# Patient Record
Sex: Female | Born: 1975 | Race: Black or African American | Hispanic: No | Marital: Married | Smoking: Never smoker
Health system: Southern US, Community
[De-identification: ages and names within clinical notes are randomized; demographics above are authoritative.]

## PROBLEM LIST (undated history)

## (undated) DIAGNOSIS — R7611 Nonspecific reaction to tuberculin skin test without active tuberculosis: Secondary | ICD-10-CM

## (undated) DIAGNOSIS — J45909 Unspecified asthma, uncomplicated: Secondary | ICD-10-CM

## (undated) DIAGNOSIS — D259 Leiomyoma of uterus, unspecified: Secondary | ICD-10-CM

## (undated) DIAGNOSIS — B019 Varicella without complication: Secondary | ICD-10-CM

## (undated) HISTORY — DX: Leiomyoma of uterus, unspecified: D25.9

## (undated) HISTORY — DX: Unspecified asthma, uncomplicated: J45.909

## (undated) HISTORY — DX: Nonspecific reaction to tuberculin skin test without active tuberculosis: R76.11

## (undated) HISTORY — DX: Varicella without complication: B01.9

---

## 2001-01-30 ENCOUNTER — Other Ambulatory Visit: Admission: RE | Admit: 2001-01-30 | Discharge: 2001-01-30 | Payer: Self-pay | Admitting: Internal Medicine

## 2002-04-09 ENCOUNTER — Inpatient Hospital Stay (HOSPITAL_COMMUNITY): Admission: AD | Admit: 2002-04-09 | Discharge: 2002-04-09 | Payer: Self-pay | Admitting: *Deleted

## 2002-04-19 ENCOUNTER — Encounter: Admission: RE | Admit: 2002-04-19 | Discharge: 2002-04-19 | Payer: Self-pay | Admitting: Obstetrics and Gynecology

## 2002-11-08 ENCOUNTER — Encounter: Payer: Self-pay | Admitting: *Deleted

## 2002-11-08 ENCOUNTER — Emergency Department (HOSPITAL_COMMUNITY): Admission: EM | Admit: 2002-11-08 | Discharge: 2002-11-08 | Payer: Self-pay | Admitting: Emergency Medicine

## 2004-08-14 ENCOUNTER — Ambulatory Visit (HOSPITAL_COMMUNITY): Admission: RE | Admit: 2004-08-14 | Discharge: 2004-08-14 | Payer: Self-pay | Admitting: Obstetrics & Gynecology

## 2005-01-06 ENCOUNTER — Inpatient Hospital Stay (HOSPITAL_COMMUNITY): Admission: AD | Admit: 2005-01-06 | Discharge: 2005-01-06 | Payer: Self-pay | Admitting: Obstetrics

## 2005-01-08 ENCOUNTER — Inpatient Hospital Stay (HOSPITAL_COMMUNITY): Admission: AD | Admit: 2005-01-08 | Discharge: 2005-01-11 | Payer: Self-pay | Admitting: Obstetrics

## 2006-05-10 ENCOUNTER — Emergency Department (HOSPITAL_COMMUNITY): Admission: EM | Admit: 2006-05-10 | Discharge: 2006-05-10 | Payer: Self-pay | Admitting: Family Medicine

## 2006-07-19 ENCOUNTER — Other Ambulatory Visit: Admission: RE | Admit: 2006-07-19 | Discharge: 2006-07-19 | Payer: Self-pay | Admitting: Internal Medicine

## 2010-02-24 ENCOUNTER — Ambulatory Visit (HOSPITAL_COMMUNITY): Admission: RE | Admit: 2010-02-24 | Discharge: 2010-02-24 | Payer: Self-pay | Admitting: Obstetrics & Gynecology

## 2010-07-16 ENCOUNTER — Inpatient Hospital Stay (HOSPITAL_COMMUNITY): Admission: RE | Admit: 2010-07-16 | Discharge: 2010-07-19 | Payer: Self-pay | Admitting: Obstetrics & Gynecology

## 2011-01-07 LAB — CBC
HCT: 33.3 % — ABNORMAL LOW (ref 36.0–46.0)
HCT: 38.7 % (ref 36.0–46.0)
Hemoglobin: 11.2 g/dL — ABNORMAL LOW (ref 12.0–15.0)
Hemoglobin: 12.9 g/dL (ref 12.0–15.0)
MCH: 28.6 pg (ref 26.0–34.0)
MCH: 28.8 pg (ref 26.0–34.0)
MCHC: 33.3 g/dL (ref 30.0–36.0)
MCHC: 33.8 g/dL (ref 30.0–36.0)
MCV: 85.3 fL (ref 78.0–100.0)
MCV: 85.6 fL (ref 78.0–100.0)
Platelets: 168 10*3/uL (ref 150–400)
Platelets: 186 10*3/uL (ref 150–400)
RBC: 3.9 MIL/uL (ref 3.87–5.11)
RBC: 4.52 MIL/uL (ref 3.87–5.11)
RDW: 14.5 % (ref 11.5–15.5)
RDW: 14.5 % (ref 11.5–15.5)
WBC: 12.2 10*3/uL — ABNORMAL HIGH (ref 4.0–10.5)
WBC: 7.9 10*3/uL (ref 4.0–10.5)

## 2011-01-07 LAB — RPR: RPR Ser Ql: NONREACTIVE

## 2011-08-16 ENCOUNTER — Other Ambulatory Visit: Payer: Self-pay

## 2011-08-16 ENCOUNTER — Other Ambulatory Visit (HOSPITAL_COMMUNITY)
Admission: RE | Admit: 2011-08-16 | Discharge: 2011-08-16 | Disposition: A | Discharge status: Home / Self Care | Payer: Self-pay | Source: Ambulatory Visit | Attending: Family Medicine | Admitting: Family Medicine

## 2011-08-16 DIAGNOSIS — Z01419 Encounter for gynecological examination (general) (routine) without abnormal findings: Secondary | ICD-10-CM | POA: Insufficient documentation

## 2013-02-19 ENCOUNTER — Other Ambulatory Visit (HOSPITAL_COMMUNITY): Payer: Self-pay | Admitting: Orthopaedic Surgery

## 2013-02-19 DIAGNOSIS — M25532 Pain in left wrist: Secondary | ICD-10-CM

## 2013-02-21 ENCOUNTER — Ambulatory Visit (HOSPITAL_COMMUNITY)
Admission: RE | Admit: 2013-02-21 | Discharge: 2013-02-21 | Disposition: A | Discharge status: Home / Self Care | Payer: Self-pay | Source: Ambulatory Visit | Attending: Orthopaedic Surgery | Admitting: Orthopaedic Surgery

## 2013-02-21 DIAGNOSIS — W19XXXA Unspecified fall, initial encounter: Secondary | ICD-10-CM | POA: Insufficient documentation

## 2013-02-21 DIAGNOSIS — S52599A Other fractures of lower end of unspecified radius, initial encounter for closed fracture: Secondary | ICD-10-CM | POA: Insufficient documentation

## 2013-02-21 DIAGNOSIS — M25539 Pain in unspecified wrist: Secondary | ICD-10-CM | POA: Insufficient documentation

## 2013-02-21 DIAGNOSIS — M25532 Pain in left wrist: Secondary | ICD-10-CM

## 2013-04-16 ENCOUNTER — Ambulatory Visit: Payer: Self-pay | Attending: Orthopedic Surgery | Admitting: Physical Therapy

## 2013-04-16 DIAGNOSIS — M25639 Stiffness of unspecified wrist, not elsewhere classified: Secondary | ICD-10-CM | POA: Insufficient documentation

## 2013-04-16 DIAGNOSIS — Z4789 Encounter for other orthopedic aftercare: Secondary | ICD-10-CM | POA: Insufficient documentation

## 2013-04-16 DIAGNOSIS — M25549 Pain in joints of unspecified hand: Secondary | ICD-10-CM | POA: Insufficient documentation

## 2013-04-16 DIAGNOSIS — IMO0001 Reserved for inherently not codable concepts without codable children: Secondary | ICD-10-CM | POA: Insufficient documentation

## 2013-04-25 ENCOUNTER — Ambulatory Visit: Payer: Self-pay | Admitting: Physical Therapy

## 2013-11-27 ENCOUNTER — Other Ambulatory Visit: Payer: Self-pay | Admitting: Family Medicine

## 2013-11-27 ENCOUNTER — Other Ambulatory Visit (HOSPITAL_COMMUNITY)
Admission: RE | Admit: 2013-11-27 | Discharge: 2013-11-27 | Disposition: A | Discharge status: Home / Self Care | Payer: 59 | Source: Ambulatory Visit | Attending: Family Medicine | Admitting: Family Medicine

## 2013-11-27 DIAGNOSIS — Z0000 Encounter for general adult medical examination without abnormal findings: Secondary | ICD-10-CM | POA: Insufficient documentation

## 2014-03-28 ENCOUNTER — Other Ambulatory Visit: Payer: Self-pay | Admitting: Family Medicine

## 2014-03-28 DIAGNOSIS — E010 Iodine-deficiency related diffuse (endemic) goiter: Secondary | ICD-10-CM

## 2014-04-02 ENCOUNTER — Encounter (INDEPENDENT_AMBULATORY_CARE_PROVIDER_SITE_OTHER): Payer: Self-pay

## 2014-04-02 ENCOUNTER — Ambulatory Visit
Admission: RE | Admit: 2014-04-02 | Discharge: 2014-04-02 | Disposition: A | Discharge status: Home / Self Care | Payer: 59 | Source: Ambulatory Visit | Attending: Family Medicine | Admitting: Family Medicine

## 2014-04-02 DIAGNOSIS — E010 Iodine-deficiency related diffuse (endemic) goiter: Secondary | ICD-10-CM

## 2016-09-05 DIAGNOSIS — R002 Palpitations: Secondary | ICD-10-CM | POA: Insufficient documentation

## 2016-09-05 DIAGNOSIS — R000 Tachycardia, unspecified: Secondary | ICD-10-CM | POA: Diagnosis not present

## 2016-09-06 ENCOUNTER — Emergency Department (HOSPITAL_COMMUNITY)
Admission: EM | Admit: 2016-09-06 | Discharge: 2016-09-06 | Disposition: A | Discharge status: Home / Self Care | Payer: 59 | Attending: Emergency Medicine | Admitting: Emergency Medicine

## 2016-09-06 ENCOUNTER — Encounter (HOSPITAL_COMMUNITY): Payer: Self-pay | Admitting: Emergency Medicine

## 2016-09-06 ENCOUNTER — Emergency Department (HOSPITAL_COMMUNITY): Payer: 59

## 2016-09-06 DIAGNOSIS — R002 Palpitations: Secondary | ICD-10-CM | POA: Diagnosis not present

## 2016-09-06 DIAGNOSIS — R000 Tachycardia, unspecified: Secondary | ICD-10-CM | POA: Diagnosis not present

## 2016-09-06 LAB — CBC
HCT: 37.9 % (ref 36.0–46.0)
Hemoglobin: 12.4 g/dL (ref 12.0–15.0)
MCH: 26.6 pg (ref 26.0–34.0)
MCHC: 32.7 g/dL (ref 30.0–36.0)
MCV: 81.3 fL (ref 78.0–100.0)
Platelets: 287 10*3/uL (ref 150–400)
RBC: 4.66 MIL/uL (ref 3.87–5.11)
RDW: 13.2 % (ref 11.5–15.5)
WBC: 4.7 10*3/uL (ref 4.0–10.5)

## 2016-09-06 LAB — DIFFERENTIAL
Basophils Absolute: 0 10*3/uL (ref 0.0–0.1)
Basophils Relative: 0 %
Eosinophils Absolute: 0.3 10*3/uL (ref 0.0–0.7)
Eosinophils Relative: 5 %
Lymphocytes Relative: 39 %
Lymphs Abs: 1.9 10*3/uL (ref 0.7–4.0)
Monocytes Absolute: 0.6 10*3/uL (ref 0.1–1.0)
Monocytes Relative: 12 %
Neutro Abs: 2 10*3/uL (ref 1.7–7.7)
Neutrophils Relative %: 44 %

## 2016-09-06 LAB — BASIC METABOLIC PANEL
Anion gap: 8 (ref 5–15)
BUN: 12 mg/dL (ref 6–20)
CO2: 25 mmol/L (ref 22–32)
Calcium: 9.3 mg/dL (ref 8.9–10.3)
Chloride: 105 mmol/L (ref 101–111)
Creatinine, Ser: 0.89 mg/dL (ref 0.44–1.00)
GFR calc Af Amer: >60 mL/min (ref >60)
GFR calc non Af Amer: >60 mL/min (ref >60)
Glucose, Bld: 81 mg/dL (ref 65–99)
Potassium: 3.9 mmol/L (ref 3.5–5.1)
Sodium: 138 mmol/L (ref 135–145)

## 2016-09-06 LAB — I-STAT TROPONIN, ED: Troponin i, poc: 0 ng/mL (ref 0.00–0.08)

## 2016-09-06 NOTE — ED Triage Notes (Signed)
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  States she may have gotten too hot in the room she was in.

## 2016-09-06 NOTE — ED Provider Notes (Signed)
[facility] DEPT Provider Note   CSN: [id number] Arrival date & time: 09/05/16  2352 By signing my name below, I, Dyke Brackett, attest that this documentation has been prepared under the direction and in the presence of Delora Fuel, MD . Electronically Signed: Dyke Brackett, Scribe. 09/06/2016. 12:59 AM.   History   Chief Complaint Chief Complaint  Patient presents with  . Tachycardia   HPI Erika Blair is a 40 y.o. female who presents to the Emergency Department complaining of sudden onset palpitations tonight which have since resolved. She notes associated nausea, lightheadedness and "feeling hot". Pt drank cold water with some relief. She states lightheadedness is exacerbated by standing up. Pt states she may have gotten too hot in the room she was in. She denies SOB, chest pain, vomiting, or diaphoresis.   The history is provided by the patient. No language interpreter was used.    History reviewed. No pertinent past medical history.  There are no active problems to display for this patient.   History reviewed. No pertinent surgical history.  OB History    No data available      Home Medications    Prior to Admission medications   Not on File    Family History No family history on file.  Social History Social History  Substance Use Topics  . Smoking status: Never Smoker  . Smokeless tobacco: Never Used  . Alcohol use No     Allergies   Patient has no allergy information on record.   Review of Systems Review of Systems  Constitutional: Negative for diaphoresis.  Respiratory: Negative for shortness of breath.   Cardiovascular: Positive for palpitations. Negative for chest pain.  Gastrointestinal: Negative for vomiting.  Neurological: Positive for light-headedness.  All other systems reviewed and are negative.  Physical Exam Updated Vital Signs BP 118/89 (BP Location: Left Arm)   Pulse 78   Temp 98.1 F (36.7 C) (Oral)   Resp 22   Ht 5'  6" (1.676 m)   Wt 190 lb (86.2 kg)   LMP 08/30/2016   SpO2 100%   BMI 30.67 kg/m   Physical Exam  Constitutional: She is oriented to person, place, and time. She appears well-developed and well-nourished.  HENT:  Head: Normocephalic and atraumatic.  Eyes: EOM are normal. Pupils are equal, round, and reactive to light.  Neck: Normal range of motion. Neck supple. No JVD present.  Cardiovascular: Normal rate, regular rhythm and normal heart sounds.   No murmur heard. Pulmonary/Chest: Effort normal and breath sounds normal. She has no wheezes. She has no rales. She exhibits no tenderness.  Abdominal: Soft. Bowel sounds are normal. She exhibits no distension and no mass. There is no tenderness.  Musculoskeletal: Normal range of motion. She exhibits no edema.  Lymphadenopathy:    She has no cervical adenopathy.  Neurological: She is alert and oriented to person, place, and time. No cranial nerve deficit. She exhibits normal muscle tone. Coordination normal.  Skin: Skin is warm and dry. No rash noted.  Psychiatric: She has a normal mood and affect. Her behavior is normal. Judgment and thought content normal.  Nursing note and vitals reviewed.  ED Treatments / Results  DIAGNOSTIC STUDIES:  Oxygen Saturation is 100% on RA, normal by my interpretation.    COORDINATION OF CARE:  12:57 AM Discussed treatment plan with pt at bedside and pt agreed to plan.  Labs (all labs ordered are listed, but only abnormal results are displayed) Labs Reviewed  BASIC METABOLIC PANEL  CBC  DIFFERENTIAL  I-STAT TROPOININ, ED    EKG  EKG Interpretation  Date/Time:  Monday September 06 2016 00:06:20 EST Ventricular Rate:  78 PR Interval:  154 QRS Duration: 86 QT Interval:  340 QTC Calculation: 387 R Axis:   74 Text Interpretation:  Normal sinus rhythm Normal ECG No old tracing to compare Confirmed by [facility]  MD, Czarina Gingras ([id number]) on 09/06/2016 12:11:49 AM      Procedures Procedures (including  critical care time)  Medications Ordered in ED Medications - No data to display   Initial Impression / Assessment and Plan / ED Course  I have reviewed the triage vital signs and the nursing notes.  Pertinent labs & imaging results that were available during my care of the patient were reviewed by me and considered in my medical decision making (see chart for details).  Clinical Course    Episode of palpitations which has resolved. Of note, patient states that she was still having the sense of rapid heartbeat at times that ECG was done and showed normal sinus rhythm. Screening labs are obtained and are normal. Orthostatic vital signs are checked showing no significant change in pulse or blood pressure. Patient is felt to be safe to return to work. Old records are reviewed, and she has no relevant past visits.  Final Clinical Impressions(s) / ED Diagnoses   Final diagnoses:  Palpitations    New Prescriptions New Prescriptions   No medications on file  I personally performed the services described in this documentation, which was scribed in my presence. The recorded information has been reviewed and is accurate.       Delora Fuel, MD [id number]

## 2016-09-28 ENCOUNTER — Telehealth: Payer: Self-pay | Admitting: *Deleted

## 2016-09-28 NOTE — Telephone Encounter (Signed)
ERROR

## 2016-11-01 ENCOUNTER — Ambulatory Visit: Payer: 59 | Admitting: Obstetrics and Gynecology

## 2016-11-01 ENCOUNTER — Encounter: Payer: Self-pay | Admitting: Obstetrics and Gynecology

## 2016-11-01 NOTE — Patient Instructions (Signed)
Laparoscopic Tubal Ligation Introduction Laparoscopic tubal ligation is a procedure to close the fallopian tubes. This is done so that you cannot get pregnant. When the fallopian tubes are closed, the eggs that your ovaries release cannot enter the uterus, and sperm cannot reach the released eggs. A laparoscopic tubal ligation is sometimes called "getting your tubes tied." You should not have this procedure if you want to get pregnant someday or if you are unsure about having more children. Tell a health care provider about:  Any allergies you have.  All medicines you are taking, including vitamins, herbs, eye drops, creams, and over-the-counter medicines.  Any problems you or family members have had with anesthetic medicines.  Any blood disorders you have.  Any surgeries you have had.  Any medical conditions you have.  Whether you are pregnant or may be pregnant.  Any past pregnancies. What are the risks? Generally, this is a safe procedure. However, problems may occur, including:  Infection.  Bleeding.  Injury to surrounding organs.  Side effects from anesthetics.  Failure of the procedure. This procedure can increase your risk of a kind of pregnancy in which a fertilized egg attaches to the outside of the uterus (ectopic pregnancy). What happens before the procedure?  Ask your health care provider about:  Changing or stopping your regular medicines. This is especially important if you are taking diabetes medicines or blood thinners.  Taking medicines such as aspirin and ibuprofen. These medicines can thin your blood. Do not take these medicines before your procedure if your health care provider instructs you not to.  Follow instructions from your health care provider about eating and drinking restrictions.  Plan to have someone take you home after the procedure.  If you go home right after the procedure, plan to have someone with you for 24 hours. What happens during  the procedure?  You will be given one or more of the following:  A medicine to help you relax (sedative).  A medicine to numb the area (local anesthetic).  A medicine to make you fall asleep (general anesthetic).  A medicine that is injected into an area of your body to numb everything below the injection site (regional anesthetic).  An IV tube will be inserted into one of your veins. It will be used to give you medicines and fluids during the procedure.  Your bladder may be emptied with a small tube (catheter).  If you have been given a general anesthetic, a tube will be put down your throat to help you breathe.  Two small cuts (incisions) will be made in your lower abdomen and near your belly button.  Your abdomen will be inflated with a gas. This will let the surgeon see better and will give the surgeon room to work.  A thin, lighted tube (laparoscope) with a camera attached will be inserted into your abdomen through one of the incisions. Small instruments will be inserted through the other incision.  The fallopian tubes will be tied off, burned (cauterized), or blocked with a clip, ring, or clamp. A small portion in the center of each fallopian tube may be removed.  The gas will be released from the abdomen.  The incisions will be closed with stitches (sutures).  A bandage (dressing) will be placed over the incisions. The procedure may vary among health care providers and hospitals. What happens after the procedure?  Your blood pressure, heart rate, breathing rate, and blood oxygen level will be monitored often until the medicines you  were given have worn off.  You will be given medicine to help with pain, nausea, and vomiting as needed. This information is not intended to replace advice given to you by your health care provider. Make sure you discuss any questions you have with your health care provider. Document Released: 01/17/2001 Document Revised: 03/18/2016 Document  Reviewed: 09/21/2015  2017 Elsevier   Contraception Choices Contraception (birth control) is the use of any methods or devices to prevent pregnancy. Below are some methods to help avoid pregnancy. Hormonal methods  Contraceptive implant. This is a thin, plastic tube containing progesterone hormone. It does not contain estrogen hormone. Your health care provider inserts the tube in the inner part of the upper arm. The tube can remain in place for up to 3 years. After 3 years, the implant must be removed. The implant prevents the ovaries from releasing an egg (ovulation), thickens the cervical mucus to prevent sperm from entering the uterus, and thins the lining of the inside of the uterus.  Progesterone-only injections. These injections are given every 3 months by your health care provider to prevent pregnancy. This synthetic progesterone hormone stops the ovaries from releasing eggs. It also thickens cervical mucus and changes the uterine lining. This makes it harder for sperm to survive in the uterus.  Birth control pills. These pills contain estrogen and progesterone hormone. They work by preventing the ovaries from releasing eggs (ovulation). They also cause the cervical mucus to thicken, preventing the sperm from entering the uterus. Birth control pills are prescribed by a health care provider.Birth control pills can also be used to treat heavy periods.  Minipill. This type of birth control pill contains only the progesterone hormone. They are taken every day of each month and must be prescribed by your health care provider.  Birth control patch. The patch contains hormones similar to those in birth control pills. It must be changed once a week and is prescribed by a health care provider.  Vaginal ring. The ring contains hormones similar to those in birth control pills. It is left in the vagina for 3 weeks, removed for 1 week, and then a new one is put back in place. The patient must be  comfortable inserting and removing the ring from the vagina.A health care provider's prescription is necessary.  Emergency contraception. Emergency contraceptives prevent pregnancy after unprotected sexual intercourse. This pill can be taken right after sex or up to 5 days after unprotected sex. It is most effective the sooner you take the pills after having sexual intercourse. Most emergency contraceptive pills are available without a prescription. Check with your pharmacist. Do not use emergency contraception as your only form of birth control. Barrier methods  Female condom. This is a thin sheath (latex or rubber) that is worn over the penis during sexual intercourse. It can be used with spermicide to increase effectiveness.  Female condom. This is a soft, loose-fitting sheath that is put into the vagina before sexual intercourse.  Diaphragm. This is a soft, latex, dome-shaped barrier that must be fitted by a health care provider. It is inserted into the vagina, along with a spermicidal jelly. It is inserted before intercourse. The diaphragm should be left in the vagina for 6 to 8 hours after intercourse.  Cervical cap. This is a round, soft, latex or plastic cup that fits over the cervix and must be fitted by a health care provider. The cap can be left in place for up to 48 hours after intercourse.  Sponge. This is a soft, circular piece of polyurethane foam. The sponge has spermicide in it. It is inserted into the vagina after wetting it and before sexual intercourse.  Spermicides. These are chemicals that kill or block sperm from entering the cervix and uterus. They come in the form of creams, jellies, suppositories, foam, or tablets. They do not require a prescription. They are inserted into the vagina with an applicator before having sexual intercourse. The process must be repeated every time you have sexual intercourse. Intrauterine contraception  Intrauterine device (IUD). This is a  T-shaped device that is put in a woman's uterus during a menstrual period to prevent pregnancy. There are 2 types:  Copper IUD. This type of IUD is wrapped in copper wire and is placed inside the uterus. Copper makes the uterus and fallopian tubes produce a fluid that kills sperm. It can stay in place for 10 years.  Hormone IUD. This type of IUD contains the hormone progestin (synthetic progesterone). The hormone thickens the cervical mucus and prevents sperm from entering the uterus, and it also thins the uterine lining to prevent implantation of a fertilized egg. The hormone can weaken or kill the sperm that get into the uterus. It can stay in place for 3-5 years, depending on which type of IUD is used. Permanent methods of contraception  Female tubal ligation. This is when the woman's fallopian tubes are surgically sealed, tied, or blocked to prevent the egg from traveling to the uterus.  Hysteroscopic sterilization. This involves placing a small coil or insert into each fallopian tube. Your doctor uses a technique called hysteroscopy to do the procedure. The device causes scar tissue to form. This results in permanent blockage of the fallopian tubes, so the sperm cannot fertilize the egg. It takes about 3 months after the procedure for the tubes to become blocked. You must use another form of birth control for these 3 months.  Female sterilization. This is when the female has the tubes that carry sperm tied off (vasectomy).This blocks sperm from entering the vagina during sexual intercourse. After the procedure, the man can still ejaculate fluid (semen). Natural planning methods  Natural family planning. This is not having sexual intercourse or using a barrier method (condom, diaphragm, cervical cap) on days the woman could become pregnant.  Calendar method. This is keeping track of the length of each menstrual cycle and identifying when you are fertile.  Ovulation method. This is avoiding sexual  intercourse during ovulation.  Symptothermal method. This is avoiding sexual intercourse during ovulation, using a thermometer and ovulation symptoms.  Post-ovulation method. This is timing sexual intercourse after you have ovulated. Regardless of which type or method of contraception you choose, it is important that you use condoms to protect against the transmission of sexually transmitted infections (STIs). Talk with your health care provider about which form of contraception is most appropriate for you. This information is not intended to replace advice given to you by your health care provider. Make sure you discuss any questions you have with your health care provider. Document Released: 10/11/2005 Document Revised: 03/18/2016 Document Reviewed: 04/05/2013 Elsevier Interactive Patient Education  2017 [employer].

## 2016-11-01 NOTE — Progress Notes (Signed)
Subjective:   Patient not seen by a provider. She is not here for GYN exam but rather IUD removal. She does not want the IUD removed today but reschedule on her day off

## 2016-11-22 ENCOUNTER — Ambulatory Visit: Payer: Self-pay | Admitting: Obstetrics and Gynecology

## 2017-02-16 ENCOUNTER — Other Ambulatory Visit (HOSPITAL_COMMUNITY)
Admission: RE | Admit: 2017-02-16 | Discharge: 2017-02-16 | Disposition: A | Discharge status: Home / Self Care | Payer: 59 | Source: Ambulatory Visit | Attending: Family | Admitting: Family

## 2017-02-16 ENCOUNTER — Other Ambulatory Visit: Payer: Self-pay

## 2017-02-16 DIAGNOSIS — Z0001 Encounter for general adult medical examination with abnormal findings: Secondary | ICD-10-CM | POA: Diagnosis not present

## 2017-02-16 DIAGNOSIS — Z0000 Encounter for general adult medical examination without abnormal findings: Secondary | ICD-10-CM | POA: Diagnosis not present

## 2017-02-16 DIAGNOSIS — R8290 Unspecified abnormal findings in urine: Secondary | ICD-10-CM | POA: Diagnosis not present

## 2017-02-16 DIAGNOSIS — L298 Other pruritus: Secondary | ICD-10-CM | POA: Diagnosis not present

## 2017-02-18 LAB — CYTOLOGY - PAP
Bacterial vaginitis: POSITIVE — AB
Candida vaginitis: POSITIVE — AB
Diagnosis: NEGATIVE
Trichomonas: NEGATIVE

## 2017-03-24 DIAGNOSIS — Z30433 Encounter for removal and reinsertion of intrauterine contraceptive device: Secondary | ICD-10-CM | POA: Diagnosis not present

## 2017-07-23 DIAGNOSIS — J22 Unspecified acute lower respiratory infection: Secondary | ICD-10-CM | POA: Diagnosis not present

## 2017-07-23 DIAGNOSIS — R05 Cough: Secondary | ICD-10-CM | POA: Diagnosis not present

## 2017-10-26 DIAGNOSIS — J111 Influenza due to unidentified influenza virus with other respiratory manifestations: Secondary | ICD-10-CM | POA: Diagnosis not present

## 2017-10-26 DIAGNOSIS — N911 Secondary amenorrhea: Secondary | ICD-10-CM | POA: Diagnosis not present

## 2017-10-26 DIAGNOSIS — R52 Pain, unspecified: Secondary | ICD-10-CM | POA: Diagnosis not present

## 2017-10-26 DIAGNOSIS — J069 Acute upper respiratory infection, unspecified: Secondary | ICD-10-CM | POA: Diagnosis not present

## 2017-10-26 MED FILL — OSELTAMIVIR PHOS 75 MG CAP: 75 | 5 days supply | Qty: 10 | Fill #0

## 2017-10-26 MED FILL — AZITHROMYCIN 250 MG TABLET: 250 | 5 days supply | Qty: 6 | Fill #0

## 2017-11-02 DIAGNOSIS — J111 Influenza due to unidentified influenza virus with other respiratory manifestations: Secondary | ICD-10-CM | POA: Diagnosis not present

## 2017-11-23 DIAGNOSIS — R102 Pelvic and perineal pain: Secondary | ICD-10-CM | POA: Diagnosis not present

## 2017-11-23 DIAGNOSIS — Z30431 Encounter for routine checking of intrauterine contraceptive device: Secondary | ICD-10-CM | POA: Diagnosis not present

## 2017-11-23 DIAGNOSIS — M25562 Pain in left knee: Secondary | ICD-10-CM | POA: Diagnosis not present

## 2017-11-23 DIAGNOSIS — N898 Other specified noninflammatory disorders of vagina: Secondary | ICD-10-CM | POA: Diagnosis not present

## 2017-11-25 MED FILL — FLUCONAZOLE 150 MG TABLET: 150 | 3 days supply | Qty: 2 | Fill #0

## 2018-08-02 ENCOUNTER — Encounter: Payer: Self-pay | Admitting: Family Medicine

## 2018-08-02 ENCOUNTER — Ambulatory Visit (INDEPENDENT_AMBULATORY_CARE_PROVIDER_SITE_OTHER): Payer: 59 | Admitting: Family Medicine

## 2018-08-02 ENCOUNTER — Other Ambulatory Visit (HOSPITAL_COMMUNITY)
Admission: RE | Admit: 2018-08-02 | Discharge: 2018-08-02 | Disposition: A | Discharge status: Home / Self Care | Payer: 59 | Source: Ambulatory Visit | Attending: Family Medicine | Admitting: Family Medicine

## 2018-08-02 VITALS — BP 120/80 | HR 84 | Temp 98.5°F | Ht 66.0 in | Wt 205.0 lb

## 2018-08-02 DIAGNOSIS — Z0000 Encounter for general adult medical examination without abnormal findings: Secondary | ICD-10-CM | POA: Diagnosis not present

## 2018-08-02 DIAGNOSIS — N898 Other specified noninflammatory disorders of vagina: Secondary | ICD-10-CM | POA: Insufficient documentation

## 2018-08-02 DIAGNOSIS — Z13220 Encounter for screening for lipoid disorders: Secondary | ICD-10-CM

## 2018-08-02 DIAGNOSIS — Z131 Encounter for screening for diabetes mellitus: Secondary | ICD-10-CM

## 2018-08-02 DIAGNOSIS — Z8709 Personal history of other diseases of the respiratory system: Secondary | ICD-10-CM

## 2018-08-02 DIAGNOSIS — Z9229 Personal history of other drug therapy: Secondary | ICD-10-CM | POA: Diagnosis not present

## 2018-08-02 DIAGNOSIS — J302 Other seasonal allergic rhinitis: Secondary | ICD-10-CM

## 2018-08-02 MED ORDER — CETIRIZINE HCL 10 MG PO TABS: 10.0000 mg | ORAL_TABLET | Freq: Every day | ORAL | 11 refills | Status: DC

## 2018-08-02 NOTE — Progress Notes (Signed)
Subjective:     Erika Blair is a 42 y.o. female and is here for a comprehensive physical exam. The patient reports problems - vaginal d/c.  Pt endorse yellowish vagina d/c.  Pt denies vaginal irritation, odor, or urinary symptoms. Pt has her 2nd Mirena IUD in place x 3 yrs.  Pt sexually active with her husband.  Pt notes itchy throat, rhinorrhea, and occasionally irritated eyes.  Pt unsure if she has a h/o allergies.  H/o asthma: no current symptoms.  May notice wheezing when has a cold or around change in weather.  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  CXR has been negative in the past.  Allergies: PCN- itching  Past Surgical hx:  None  Social Hx.: Pt is married.  Pt has an 27 yo son.  Pt works as an Development worker, community at [employer].  Pt denies EtOH, tobacco, and drug use.  Last pap--2018 Pt has never had a mammogram.  Social History   Socioeconomic History  . Marital status: Married    Spouse name: Not on file  . Number of children: Not on file  . Years of education: Not on file  . Highest education level: Not on file  Occupational History  . Not on file  Social Needs  . Financial resource strain: Not on file  . Food insecurity:    Worry: Not on file    Inability: Not on file  . Transportation needs:    Medical: Not on file    Non-medical: Not on file  Tobacco Use  . Smoking status: Never Smoker  . Smokeless tobacco: Never Used  Substance and Sexual Activity  . Alcohol use: No  . Drug use: No  . Sexual activity: Yes    Birth control/protection: IUD  Lifestyle  . Physical activity:    Days per week: Not on file    Minutes per session: Not on file  . Stress: Not on file  Relationships  . Social connections:    Talks on phone: Not on file    Gets together: Not on file    Attends religious service: Not on file    Active member of club or organization: Not on file    Attends meetings of clubs or organizations: Not on file    Relationship  status: Not on file  . Intimate partner violence:    Fear of current or ex partner: Not on file    Emotionally abused: Not on file    Physically abused: Not on file    Forced sexual activity: Not on file  Other Topics Concern  . Not on file  Social History Narrative  . Not on file   Health Maintenance  Topic Date Due  . HIV Screening  02/25/1993  . TETANUS/TDAP  02/25/1997  . PAP SMEAR  02/17/2020  . INFLUENZA VACCINE  Completed    The following portions of the patient's history were reviewed and updated as appropriate: allergies, current medications, past family history, past medical history, past social history, past surgical history and problem list.  Review of Systems Pertinent items noted in HPI and remainder of comprehensive ROS otherwise negative.   Objective:    BP 120/80 (BP Location: Left Arm, Patient Position: Sitting, Cuff Size: Normal)   Pulse 84   Temp 98.5 F (36.9 C) (Oral)   Ht 5\' 6"  (1.676 m)   Wt 205 lb (93 kg)   SpO2 98%   BMI 33.09 kg/m  General appearance: alert,  cooperative, appears stated age and no distress Head: Normocephalic, without obvious abnormality, atraumatic Eyes: conjunctivae/corneas clear. PERRL, EOM's intact. Fundi benign. Ears: normal TM's and external ear canals both ears Nose: Nares normal. Septum midline. Mucosa normal. No drainage or sinus tenderness. Throat: lips, mucosa, and tongue normal; teeth and gums normal Neck: no adenopathy, no carotid bruit, no JVD, supple, symmetrical, trachea midline and thyroid not enlarged, symmetric, no tenderness/mass/nodules Lungs: clear to auscultation bilaterally Heart: regular rate and rhythm, S1, S2 normal, no murmur, click, rub or gallop Abdomen: soft, non-tender; bowel sounds normal; no masses,  no organomegaly Extremities: extremities normal, atraumatic, no cyanosis or edema Skin: Skin color, texture, turgor normal. No rashes or lesions Neurologic: Alert and oriented X 3, normal strength  and tone. Normal symmetric reflexes. Normal coordination and gait    Assessment:    Healthy female exam.      Plan:     Anticipatory guidance given including wearing seatbelts, smoke detectors in the home, increasing physical activity, increasing p.o. intake of water and vegetables. -will obtain labs -pap up to date, done 2018 -will have pt schedule mammogram -given handout See After Visit Summary for Counseling Recommendations    Vaginal d/c -Mirena IUD in place -will send pt self-swab to lab for testing  F/u prn  Grier Mitts, MD

## 2018-08-02 NOTE — Patient Instructions (Addendum)
You can take an allergy medication for your runny nose, itchy throat, irritated eyes.  I have sent in a prescription for Zyrtec to your pharmacy.  This medication may also be found over-the-counter at your local pharmacy.  Other allergy medications that are okay to take include Claritin, Allegra, or Xyzal.  For some people when make medication may work better than another so there may be some trial and error-trying to find one that is right for you.  Local honey is also a good option to help with allergies.  Local honey can be found at various grocery stores and at your local pharmacy market. Allergies An allergy is when your body reacts to a substance in a way that is not normal. An allergic reaction can happen after you:  Eat something.  Breathe in something.  Touch something.  You can be allergic to:  Things that are only around during certain seasons, like molds and pollens.  Foods.  Drugs.  Insects.  Animal dander.  What are the signs or symptoms?  Puffiness (swelling). This may happen on the lips, face, tongue, mouth, or throat.  Sneezing.  Coughing.  Breathing loudly (wheezing).  Stuffy nose.  Tingling in the mouth.  A rash.  Itching.  Itchy, red, puffy areas of skin (hives).  Watery eyes.  Throwing up (vomiting).  Watery poop (diarrhea).  Dizziness.  Feeling faint or fainting.  Trouble breathing or swallowing.  A tight feeling in the chest.  A fast heartbeat. How is this diagnosed? Allergies can be diagnosed with:  A medical and family history.  Skin tests.  Blood tests.  A food diary. A food diary is a record of all the foods, drinks, and symptoms you have each day.  The results of an elimination diet. This diet involves making sure not to eat certain foods and then seeing what happens when you start eating them again.  How is this treated? There is no cure for allergies, but allergic reactions can be treated with medicine. Severe  reactions usually need to be treated at a hospital. How is this prevented? The best way to prevent an allergic reaction is to avoid the thing you are allergic to. Allergy shots and medicines can also help prevent reactions in some cases. This information is not intended to replace advice given to you by your health care provider. Make sure you discuss any questions you have with your health care provider. Document Released: 02/05/2013 Document Revised: 06/07/2016 Document Reviewed: 07/23/2014 Elsevier Interactive Patient Education  2018 [name], Female Preventive care refers to lifestyle choices and visits with your health care provider that can promote health and wellness. What does preventive care include?  A yearly physical exam. This is also called an annual well check.  Dental exams once or twice a year.  Routine eye exams. Ask your health care provider how often you should have your eyes checked.  Personal lifestyle choices, including: ? Daily care of your teeth and gums. ? Regular physical activity. ? Eating a healthy diet. ? Avoiding tobacco and drug use. ? Limiting alcohol use. ? Practicing safe sex. ? Taking low-dose aspirin daily starting at age 27. ? Taking vitamin and mineral supplements as recommended by your health care provider. What happens during an annual well check? The services and screenings done by your health care provider during your annual well check will depend on your age, overall health, lifestyle risk factors, and family history of disease. Counseling Your health  care provider may ask you questions about your:  Alcohol use.  Tobacco use.  Drug use.  Emotional well-being.  Home and relationship well-being.  Sexual activity.  Eating habits.  Work and work Statistician.  Method of birth control.  Menstrual cycle.  Pregnancy history.  Screening You may have the following tests or  measurements:  Height, weight, and BMI.  Blood pressure.  Lipid and cholesterol levels. These may be checked every 5 years, or more frequently if you are over 86 years old.  Skin check.  Lung cancer screening. You may have this screening every year starting at age 29 if you have a 30-pack-year history of smoking and currently smoke or have quit within the past 15 years.  Fecal occult blood test (FOBT) of the stool. You may have this test every year starting at age 63.  Flexible sigmoidoscopy or colonoscopy. You may have a sigmoidoscopy every 5 years or a colonoscopy every 10 years starting at age 10.  Hepatitis C blood test.  Hepatitis B blood test.  Sexually transmitted disease (STD) testing.  Diabetes screening. This is done by checking your blood sugar (glucose) after you have not eaten for a while (fasting). You may have this done every 1-3 years.  Mammogram. This may be done every 1-2 years. Talk to your health care provider about when you should start having regular mammograms. This may depend on whether you have a family history of breast cancer.  BRCA-related cancer screening. This may be done if you have a family history of breast, ovarian, tubal, or peritoneal cancers.  Pelvic exam and Pap test. This may be done every 3 years starting at age 72. Starting at age 73, this may be done every 5 years if you have a Pap test in combination with an HPV test.  Bone density scan. This is done to screen for osteoporosis. You may have this scan if you are at high risk for osteoporosis.  Discuss your test results, treatment options, and if necessary, the need for more tests with your health care provider. Vaccines Your health care provider may recommend certain vaccines, such as:  Influenza vaccine. This is recommended every year.  Tetanus, diphtheria, and acellular pertussis (Tdap, Td) vaccine. You may need a Td booster every 10 years.  Varicella vaccine. You may need this if  you have not been vaccinated.  Zoster vaccine. You may need this after age 30.  Measles, mumps, and rubella (MMR) vaccine. You may need at least one dose of MMR if you were born in 1957 or later. You may also need a second dose.  Pneumococcal 13-valent conjugate (PCV13) vaccine. You may need this if you have certain conditions and were not previously vaccinated.  Pneumococcal polysaccharide (PPSV23) vaccine. You may need one or two doses if you smoke cigarettes or if you have certain conditions.  Meningococcal vaccine. You may need this if you have certain conditions.  Hepatitis A vaccine. You may need this if you have certain conditions or if you travel or work in places where you may be exposed to hepatitis A.  Hepatitis B vaccine. You may need this if you have certain conditions or if you travel or work in places where you may be exposed to hepatitis B.  Haemophilus influenzae type b (Hib) vaccine. You may need this if you have certain conditions.  Talk to your health care provider about which screenings and vaccines you need and how often you need them. This information is not intended to  replace advice given to you by your health care provider. Make sure you discuss any questions you have with your health care provider. Document Released: 11/07/2015 Document Revised: 06/30/2016 Document Reviewed: 08/12/2015 Elsevier Interactive Patient Education  [employer].

## 2018-08-03 LAB — BASIC METABOLIC PANEL
BUN: 13 mg/dL (ref 6–23)
CO2: 27 mEq/L (ref 19–32)
Calcium: 9.1 mg/dL (ref 8.4–10.5)
Chloride: 103 mEq/L (ref 96–112)
Creatinine, Ser: 0.78 mg/dL (ref 0.40–1.20)
GFR: 104.97 mL/min (ref >60.00)
Glucose, Bld: 84 mg/dL (ref 70–99)
Potassium: 4.1 mEq/L (ref 3.5–5.1)
Sodium: 137 mEq/L (ref 135–145)

## 2018-08-03 LAB — CBC WITH DIFFERENTIAL/PLATELET
Basophils Absolute: 0.1 10*3/uL (ref 0.0–0.1)
Basophils Relative: 1.1 % (ref 0.0–3.0)
Eosinophils Absolute: 0.3 10*3/uL (ref 0.0–0.7)
Eosinophils Relative: 4.4 % (ref 0.0–5.0)
HCT: 38.7 % (ref 36.0–46.0)
Hemoglobin: 12.7 g/dL (ref 12.0–15.0)
Lymphocytes Relative: 33.7 % (ref 12.0–46.0)
Lymphs Abs: 2 10*3/uL (ref 0.7–4.0)
MCHC: 32.8 g/dL (ref 30.0–36.0)
MCV: 82.5 fl (ref 78.0–100.0)
Monocytes Absolute: 0.8 10*3/uL (ref 0.1–1.0)
Monocytes Relative: 13.4 % — ABNORMAL HIGH (ref 3.0–12.0)
Neutro Abs: 2.8 10*3/uL (ref 1.4–7.7)
Neutrophils Relative %: 47.4 % (ref 43.0–77.0)
Platelets: 278 10*3/uL (ref 150.0–400.0)
RBC: 4.69 Mil/uL (ref 3.87–5.11)
RDW: 13.1 % (ref 11.5–15.5)
WBC: 5.9 10*3/uL (ref 4.0–10.5)

## 2018-08-03 LAB — HEMOGLOBIN A1C: Hgb A1c MFr Bld: 5.9 % (ref 4.6–6.5)

## 2018-08-03 LAB — LIPID PANEL
Cholesterol: 133 mg/dL (ref 0–200)
HDL: 49.5 mg/dL (ref >39.00)
LDL Cholesterol: 73 mg/dL (ref 0–99)
NonHDL: 83.46
Total CHOL/HDL Ratio: 3
Triglycerides: 51 mg/dL (ref 0.0–149.0)
VLDL: 10.2 mg/dL (ref 0.0–40.0)

## 2018-08-04 LAB — CERVICOVAGINAL ANCILLARY ONLY
Bacterial vaginitis: NEGATIVE
CANDIDA VAGINITIS: POSITIVE — AB
CHLAMYDIA, DNA PROBE: NEGATIVE
Neisseria Gonorrhea: NEGATIVE
TRICH (WINDOWPATH): NEGATIVE

## 2018-08-09 ENCOUNTER — Other Ambulatory Visit: Payer: Self-pay | Admitting: Family Medicine

## 2018-08-09 DIAGNOSIS — B3731 Acute candidiasis of vulva and vagina: Secondary | ICD-10-CM

## 2018-08-09 DIAGNOSIS — B373 Candidiasis of vulva and vagina: Secondary | ICD-10-CM

## 2018-08-09 MED ORDER — FLUCONAZOLE 150 MG PO TABS: ORAL_TABLET | ORAL | 0 refills | Status: DC

## 2018-08-09 MED FILL — FLUCONAZOLE 150 MG TABS: 150 | 3 days supply | Qty: 2 | Fill #0

## 2018-08-14 ENCOUNTER — Telehealth: Payer: Self-pay | Admitting: *Deleted

## 2018-08-14 NOTE — Telephone Encounter (Signed)
Reviewed lab results and physician's note with patient. Answered questions.

## 2018-08-30 ENCOUNTER — Telehealth: Payer: Self-pay | Admitting: Family Medicine

## 2018-08-30 NOTE — Telephone Encounter (Signed)
Copied from [place] [id number]. Topic: Quick Communication - See Telephone Encounter >> Aug 30, 2018  2:19 PM Rutherford Nail, [place] wrote: CRM for notification. See Telephone encounter for: 08/30/18. Patient calling and states that she would like a call back from Dr Volanda Napoleon. States that she was tested positive for something at her last visit and has taken the medication, but does not think the medication is helping. Please advise.  CB#: [phone]

## 2018-08-31 NOTE — Telephone Encounter (Signed)
Pt is scheduled to see Dr bANKS ON 09/04/2018 at 11.30 am

## 2018-08-31 NOTE — Telephone Encounter (Signed)
Pt should be seen, last OFV was 10/9?Marland Kitchen

## 2018-08-31 NOTE — Telephone Encounter (Signed)
Spoke with pt states that she tested positive for yeast infection and she completed the course of the Antibiotics prescribed, Pt states that she still has brown discharge with no odor. Pt wants to know if she can come in for further testing or any antibiotics can be sent to her pharmacy. Please Advise

## 2018-09-04 ENCOUNTER — Encounter: Payer: Self-pay | Admitting: Family Medicine

## 2018-09-04 ENCOUNTER — Other Ambulatory Visit (HOSPITAL_COMMUNITY)
Admission: RE | Admit: 2018-09-04 | Discharge: 2018-09-04 | Disposition: A | Discharge status: Home / Self Care | Payer: 59 | Source: Ambulatory Visit | Attending: Family Medicine | Admitting: Family Medicine

## 2018-09-04 ENCOUNTER — Ambulatory Visit: Payer: 59 | Admitting: Family Medicine

## 2018-09-04 VITALS — BP 100/62 | HR 84 | Temp 98.3°F | Wt 200.0 lb

## 2018-09-04 DIAGNOSIS — N760 Acute vaginitis: Secondary | ICD-10-CM

## 2018-09-04 DIAGNOSIS — L900 Lichen sclerosus et atrophicus: Secondary | ICD-10-CM

## 2018-09-04 MED ORDER — CLOBETASOL PROPIONATE 0.05 % EX CREA: 1.0000 "application " | TOPICAL_CREAM | Freq: Every day | CUTANEOUS | 0 refills | Status: AC

## 2018-09-04 MED FILL — CLOBETASOL PROPIONATE 0.05: 0.05 | 7 days supply | Qty: 30 | Fill #0

## 2018-09-04 NOTE — Patient Instructions (Signed)
Lichen Sclerosus Lichen sclerosus is a skin problem. It can happen on any part of the body. It happens most often in the anal or genital areas. It can cause itching and discomfort. Treatment can help to control symptoms. This skin problem is not passed from one person to another (not contagious). The cause is not known. Follow these instructions at home:  Take over-the-counter and prescription medicines only as told by your doctor.  Use creams or ointments as told by your doctor.  Do not scratch the affected areas of skin.  Women should keep the vagina as clean and dry as they can.  Keep all follow-up visits as told by your doctor. This is important. Contact a doctor if:  Your redness, swelling, or pain gets worse.  You have fluid, blood, or pus coming from the area.  You have new patches (lesions) on your skin.  You have a fever.  You have pain during sex. This information is not intended to replace advice given to you by your health care provider. Make sure you discuss any questions you have with your health care provider. Document Released: 09/23/2008 Document Revised: 03/18/2016 Document Reviewed: 01/06/2015 Elsevier Interactive Patient Education  [id number].  Lichen Sclerosus Lichen sclerosus is a skin problem. It can happen on any part of the body. It happens most often in the anal or genital areas. It can cause itching and discomfort. Treatment can help to control symptoms. This skin problem is not passed from one person to another (not contagious). The cause is not known. Follow these instructions at home:  Take over-the-counter and prescription medicines only as told by your doctor.  Use creams or ointments as told by your doctor.  Do not scratch the affected areas of skin.  Women should keep the vagina as clean and dry as they can.  Keep all follow-up visits as told by your doctor. This is important. Contact a doctor if:  Your redness, swelling, or pain  gets worse.  You have fluid, blood, or pus coming from the area.  You have new patches (lesions) on your skin.  You have a fever.  You have pain during sex. This information is not intended to replace advice given to you by your health care provider. Make sure you discuss any questions you have with your health care provider. Document Released: 09/23/2008 Document Revised: 03/18/2016 Document Reviewed: 01/06/2015 Elsevier Interactive Patient Education  [employer].

## 2018-09-04 NOTE — Progress Notes (Signed)
Subjective:    Patient ID: Erika Blair, female    DOB: [birth year], 42 y.o.   MRN: [id number]  No chief complaint on file.   HPI Patient was seen today for ongoing concern.  Pt endorses continued vaginal d/c, noted as yellow-ish in color and vaginal irritation.  Pt had diflucan at the beginning of October but states symptoms continued.  Pt has IUD in place.  This is her 2nd IUD.  Pt notes d/c is so much that she is wearing panty liners, may change them a few times per day.  Also endorses the sensation of the d/c coming out similar to the feeling of blood clots passing.  Pt denies abd pain, fever, urinary frequency, odor.  Pt also denies use of depilatory creams, uses a Copy.    Past Medical History:  Diagnosis Date  . Asthma   . Chicken pox   . Positive TB test     Allergies  Allergen Reactions  . Penicillins Itching    ROS General: Denies fever, chills, night sweats, changes in weight, changes in appetite HEENT: Denies headaches, ear pain, changes in vision, rhinorrhea, sore throat CV: Denies CP, palpitations, SOB, orthopnea Pulm: Denies SOB, cough, wheezing GI: Denies abdominal pain, nausea, vomiting, diarrhea, constipation GU: Denies dysuria, hematuria, frequency  +vaginal discharge, irritation Msk: Denies muscle cramps, joint pains Neuro: Denies weakness, numbness, tingling Skin: Denies rashes, bruising Psych: Denies depression, anxiety, hallucinations      Objective:    Blood pressure 100/62, pulse 84, temperature 98.3 F (36.8 C), temperature source Oral, weight 200 lb (90.7 kg), SpO2 98 %.   Gen. Pleasant, well-nourished, in no distress, normal affect   Lungs: no accessory muscle use Cardiovascular: RRR,  no peripheral edema GU: external surface of labia majora with excoriations and hypertrophic pinkish colored skin with a few abrasions.  No other lesions noted.  Clear d/c noted, vaginal swab obtained.  Chaperone present:N. [place]. Abdomen: BS  present, soft, NT/ND  Wt Readings from Last 3 Encounters:  09/04/18 200 lb (90.7 kg)  08/02/18 205 lb (93 kg)  11/01/16 189 lb (85.7 kg)    Lab Results  Component Value Date   WBC 5.9 08/02/2018   HGB 12.7 08/02/2018   HCT 38.7 08/02/2018   PLT 278.0 08/02/2018   GLUCOSE 84 08/02/2018   CHOL 133 08/02/2018   TRIG 51.0 08/02/2018   HDL 49.50 08/02/2018   LDLCALC 73 08/02/2018   NA 137 08/02/2018   K 4.1 08/02/2018   CL 103 08/02/2018   CREATININE 0.78 08/02/2018   BUN 13 08/02/2018   CO2 27 08/02/2018   HGBA1C 5.9 08/02/2018    Assessment/Plan:  Acute vaginitis  - Plan: Cervicovaginal ancillary only -will send in rx if needed based on lab results.  Lichen sclerosus  -given handout -advised to avoid scratching - Plan: clobetasol cream (TEMOVATE) 0.05 %.  Limit use pf cream to avoid skin atrophy.  F/u prn  Grier Mitts, MD

## 2018-09-05 LAB — CERVICOVAGINAL ANCILLARY ONLY
Bacterial vaginitis: NEGATIVE
Candida vaginitis: POSITIVE — AB
Chlamydia: NEGATIVE
Neisseria Gonorrhea: NEGATIVE
TRICH (WINDOWPATH): NEGATIVE

## 2018-09-06 ENCOUNTER — Other Ambulatory Visit: Payer: Self-pay | Admitting: Family Medicine

## 2018-09-06 DIAGNOSIS — B373 Candidiasis of vulva and vagina: Secondary | ICD-10-CM

## 2018-09-06 DIAGNOSIS — B3731 Acute candidiasis of vulva and vagina: Secondary | ICD-10-CM

## 2018-09-06 MED ORDER — FLUCONAZOLE 150 MG PO TABS: ORAL_TABLET | ORAL | 0 refills | Status: DC

## 2018-09-06 MED FILL — FLUCONAZOLE 150 MG TABS: 150 | 3 days supply | Qty: 2 | Fill #0

## 2018-09-08 ENCOUNTER — Telehealth: Payer: Self-pay | Admitting: Family Medicine

## 2018-09-08 NOTE — Telephone Encounter (Signed)
Copied from [place] [id number]. Topic: Quick Communication - Rx Refill/Question >> Sep 08, 2018  4:22 PM Rayann Heman wrote: Medication: Boric acid 600 mg. Pt states that she can not find this medication over the counter. Could this be sent to pharmacy. Pt states that she has been looking for 3 days at multiple places for medication.  Please advice [phone]

## 2018-09-11 NOTE — Telephone Encounter (Signed)
Called pt left a detailed message to check for the Rx at [facility] or ask a pharmacist for assistance.

## 2019-09-12 DIAGNOSIS — Z01419 Encounter for gynecological examination (general) (routine) without abnormal findings: Secondary | ICD-10-CM | POA: Diagnosis not present

## 2019-09-12 DIAGNOSIS — N760 Acute vaginitis: Secondary | ICD-10-CM | POA: Diagnosis not present

## 2019-09-12 DIAGNOSIS — Z113 Encounter for screening for infections with a predominantly sexual mode of transmission: Secondary | ICD-10-CM | POA: Diagnosis not present

## 2019-09-12 DIAGNOSIS — N766 Ulceration of vulva: Secondary | ICD-10-CM | POA: Diagnosis not present

## 2019-09-12 DIAGNOSIS — Z6833 Body mass index (BMI) 33.0-33.9, adult: Secondary | ICD-10-CM | POA: Diagnosis not present

## 2019-09-12 MED FILL — VALACYCLOVIR HCL 500 MG TAB: 500 | 14 days supply | Qty: 14 | Fill #0

## 2019-09-19 DIAGNOSIS — D259 Leiomyoma of uterus, unspecified: Secondary | ICD-10-CM | POA: Diagnosis not present

## 2019-09-19 DIAGNOSIS — Z1231 Encounter for screening mammogram for malignant neoplasm of breast: Secondary | ICD-10-CM | POA: Diagnosis not present

## 2019-09-19 DIAGNOSIS — A609 Anogenital herpesviral infection, unspecified: Secondary | ICD-10-CM | POA: Diagnosis not present

## 2019-09-24 ENCOUNTER — Other Ambulatory Visit: Payer: Self-pay | Admitting: Obstetrics and Gynecology

## 2019-09-24 DIAGNOSIS — R928 Other abnormal and inconclusive findings on diagnostic imaging of breast: Secondary | ICD-10-CM

## 2019-09-27 ENCOUNTER — Other Ambulatory Visit: Payer: Self-pay

## 2019-09-27 ENCOUNTER — Ambulatory Visit
Admission: RE | Admit: 2019-09-27 | Discharge: 2019-09-27 | Disposition: A | Discharge status: Home / Self Care | Payer: 59 | Source: Ambulatory Visit | Attending: Obstetrics and Gynecology | Admitting: Obstetrics and Gynecology

## 2019-09-27 ENCOUNTER — Other Ambulatory Visit: Payer: Self-pay | Admitting: Obstetrics and Gynecology

## 2019-09-27 DIAGNOSIS — R921 Mammographic calcification found on diagnostic imaging of breast: Secondary | ICD-10-CM

## 2019-09-27 DIAGNOSIS — N63 Unspecified lump in unspecified breast: Secondary | ICD-10-CM

## 2019-09-27 DIAGNOSIS — R928 Other abnormal and inconclusive findings on diagnostic imaging of breast: Secondary | ICD-10-CM

## 2019-09-27 DIAGNOSIS — N6312 Unspecified lump in the right breast, upper inner quadrant: Secondary | ICD-10-CM | POA: Diagnosis not present

## 2019-10-02 DIAGNOSIS — H52223 Regular astigmatism, bilateral: Secondary | ICD-10-CM | POA: Diagnosis not present

## 2019-10-02 DIAGNOSIS — H5202 Hypermetropia, left eye: Secondary | ICD-10-CM | POA: Diagnosis not present

## 2019-10-02 DIAGNOSIS — B009 Herpesviral infection, unspecified: Secondary | ICD-10-CM | POA: Diagnosis not present

## 2019-10-02 DIAGNOSIS — H524 Presbyopia: Secondary | ICD-10-CM | POA: Diagnosis not present

## 2020-03-28 ENCOUNTER — Other Ambulatory Visit: Payer: Self-pay | Admitting: Obstetrics and Gynecology

## 2020-03-28 ENCOUNTER — Other Ambulatory Visit: Payer: Self-pay

## 2020-03-28 ENCOUNTER — Ambulatory Visit
Admission: RE | Admit: 2020-03-28 | Discharge: 2020-03-28 | Disposition: A | Discharge status: Home / Self Care | Payer: 59 | Source: Ambulatory Visit | Attending: Obstetrics and Gynecology | Admitting: Obstetrics and Gynecology

## 2020-03-28 DIAGNOSIS — R921 Mammographic calcification found on diagnostic imaging of breast: Secondary | ICD-10-CM

## 2020-03-28 DIAGNOSIS — N63 Unspecified lump in unspecified breast: Secondary | ICD-10-CM

## 2020-03-28 DIAGNOSIS — N6312 Unspecified lump in the right breast, upper inner quadrant: Secondary | ICD-10-CM | POA: Diagnosis not present

## 2020-10-03 ENCOUNTER — Other Ambulatory Visit: Payer: 59

## 2020-10-29 ENCOUNTER — Other Ambulatory Visit: Payer: Self-pay

## 2020-10-29 ENCOUNTER — Emergency Department (HOSPITAL_COMMUNITY): Payer: 59

## 2020-10-29 ENCOUNTER — Emergency Department (HOSPITAL_COMMUNITY)
Admission: EM | Admit: 2020-10-29 | Discharge: 2020-10-29 | Disposition: A | Discharge status: Home / Self Care | Payer: 59 | Attending: Emergency Medicine | Admitting: Emergency Medicine

## 2020-10-29 ENCOUNTER — Encounter (HOSPITAL_COMMUNITY): Payer: Self-pay

## 2020-10-29 DIAGNOSIS — M549 Dorsalgia, unspecified: Secondary | ICD-10-CM | POA: Diagnosis not present

## 2020-10-29 DIAGNOSIS — R0602 Shortness of breath: Secondary | ICD-10-CM | POA: Diagnosis not present

## 2020-10-29 DIAGNOSIS — Z5321 Procedure and treatment not carried out due to patient leaving prior to being seen by health care provider: Secondary | ICD-10-CM | POA: Insufficient documentation

## 2020-10-29 LAB — BASIC METABOLIC PANEL
Anion gap: 9 (ref 5–15)
BUN: 10 mg/dL (ref 6–20)
CO2: 24 mmol/L (ref 22–32)
Calcium: 9.3 mg/dL (ref 8.9–10.3)
Chloride: 103 mmol/L (ref 98–111)
Creatinine, Ser: 0.78 mg/dL (ref 0.44–1.00)
GFR, Estimated: >60 mL/min (ref >60)
Glucose, Bld: 81 mg/dL (ref 70–99)
Potassium: 3.7 mmol/L (ref 3.5–5.1)
Sodium: 136 mmol/L (ref 135–145)

## 2020-10-29 LAB — CBC
HCT: 41.9 % (ref 36.0–46.0)
Hemoglobin: 13.2 g/dL (ref 12.0–15.0)
MCH: 26.7 pg (ref 26.0–34.0)
MCHC: 31.5 g/dL (ref 30.0–36.0)
MCV: 84.8 fL (ref 80.0–100.0)
Platelets: 308 10*3/uL (ref 150–400)
RBC: 4.94 MIL/uL (ref 3.87–5.11)
RDW: 13.1 % (ref 11.5–15.5)
WBC: 6.6 10*3/uL (ref 4.0–10.5)
nRBC: 0 % (ref 0.0–0.2)

## 2020-10-29 LAB — I-STAT BETA HCG BLOOD, ED (MC, WL, AP ONLY): I-stat hCG, quantitative: <5 m[IU]/mL (ref <5)

## 2020-10-29 LAB — TROPONIN I (HIGH SENSITIVITY): Troponin I (High Sensitivity): <2 ng/L (ref <18)

## 2020-10-29 NOTE — ED Notes (Signed)
Called for Pt x3 no answer. Also do not see Pt in Lobby currently

## 2020-10-29 NOTE — ED Notes (Signed)
Called for Pt to recheck vitals. No answer 

## 2020-10-29 NOTE — ED Triage Notes (Signed)
Pt reports pain in between her shoulder blades when she takes in a deep breath. Ongoing since before christmas. Denies chest pain or sob. Resp e.u at this time

## 2020-10-30 ENCOUNTER — Other Ambulatory Visit (HOSPITAL_COMMUNITY): Payer: Self-pay | Admitting: Family Medicine

## 2020-10-30 DIAGNOSIS — T148XXA Other injury of unspecified body region, initial encounter: Secondary | ICD-10-CM | POA: Diagnosis not present

## 2020-10-30 MED FILL — MELOXICAM 15 MG TABLET: 15 | 30 days supply | Qty: 30 | Fill #0

## 2020-10-30 MED FILL — CYCLOBENZAPRINE HCL 10 MG T: 10 | 10 days supply | Qty: 30 | Fill #0

## 2021-05-20 ENCOUNTER — Ambulatory Visit: Payer: 59 | Admitting: Nurse Practitioner

## 2021-05-20 ENCOUNTER — Other Ambulatory Visit: Payer: Self-pay

## 2021-05-20 ENCOUNTER — Encounter: Payer: Self-pay | Admitting: Nurse Practitioner

## 2021-05-20 ENCOUNTER — Other Ambulatory Visit (HOSPITAL_COMMUNITY)
Admission: RE | Admit: 2021-05-20 | Discharge: 2021-05-20 | Disposition: A | Discharge status: Home / Self Care | Payer: 59 | Source: Ambulatory Visit | Attending: Nurse Practitioner | Admitting: Nurse Practitioner

## 2021-05-20 VITALS — BP 108/72 | HR 91 | Temp 98.1°F | Ht 66.0 in | Wt 210.6 lb

## 2021-05-20 DIAGNOSIS — B373 Candidiasis of vulva and vagina: Secondary | ICD-10-CM | POA: Insufficient documentation

## 2021-05-20 DIAGNOSIS — Z23 Encounter for immunization: Secondary | ICD-10-CM

## 2021-05-20 DIAGNOSIS — Z7689 Persons encountering health services in other specified circumstances: Secondary | ICD-10-CM | POA: Diagnosis not present

## 2021-05-20 DIAGNOSIS — B3731 Acute candidiasis of vulva and vagina: Secondary | ICD-10-CM

## 2021-05-20 DIAGNOSIS — Z6833 Body mass index (BMI) 33.0-33.9, adult: Secondary | ICD-10-CM | POA: Diagnosis not present

## 2021-05-20 DIAGNOSIS — Z0000 Encounter for general adult medical examination without abnormal findings: Secondary | ICD-10-CM | POA: Diagnosis not present

## 2021-05-20 DIAGNOSIS — Z118 Encounter for screening for other infectious and parasitic diseases: Secondary | ICD-10-CM | POA: Insufficient documentation

## 2021-05-20 DIAGNOSIS — Z1151 Encounter for screening for human papillomavirus (HPV): Secondary | ICD-10-CM | POA: Diagnosis not present

## 2021-05-20 DIAGNOSIS — E559 Vitamin D deficiency, unspecified: Secondary | ICD-10-CM

## 2021-05-20 DIAGNOSIS — Z124 Encounter for screening for malignant neoplasm of cervix: Secondary | ICD-10-CM | POA: Insufficient documentation

## 2021-05-20 DIAGNOSIS — E661 Drug-induced obesity: Secondary | ICD-10-CM | POA: Diagnosis not present

## 2021-05-20 NOTE — Progress Notes (Signed)
[employer] as a Education administrator for [employer], NP.,have documented all relevant documentation on the behalf of [employer], NP,as directed by  Bary Castilla, NP while in the presence of Bary Castilla, NP.  This visit occurred during the SARS-CoV-2 public health emergency.  Safety protocols were in place, including screening questions prior to the visit, additional usage of staff PPE, and extensive cleaning of exam room while observing appropriate contact time as indicated for disinfecting solutions.  Subjective:     Patient ID: Erika Blair , female    DOB: 1975/12/27 , 45 y.o.   MRN: [id number]   Chief Complaint  Patient presents with   Establish Care    HPI  Pt is here for HM and to establish care. Pt has no concerns or questions at the moment. Pt also stated she will follow up with PA for HM needs. She needs a paps smear today.  Diet: She does have a diet. She eats everything. Fish and chicken She tries to walk for exercise.  Drink or smoke: No 2 kids. Married  LMP: not sure she does have an IUD.      Past Medical History:  Diagnosis Date   Asthma    Chicken pox    Positive TB test      Family History  Problem Relation Age of Onset   Diabetes Mother    Hypertension Sister    Diabetes Sister    Diabetes Brother      Current Outpatient Medications:    cetirizine (ZYRTEC) 10 MG tablet, Take 1 tablet (10 mg total) by mouth daily., Disp: 30 tablet, Rfl: 11   cyclobenzaprine (FLEXERIL) 10 MG tablet, TAKE 1 TABLET BY MOUTH 3 TIMES A DAY AS NEEDED FOR 10 DAYS, Disp: 30 tablet, Rfl: 1   fluconazole (DIFLUCAN) 150 MG tablet, Take one tab now, then repeat in 3 days., Disp: 2 tablet, Rfl: 0   meloxicam (MOBIC) 15 MG tablet, TAKE 1 TABLET BY MOUTH ONCE A DAY, Disp: 30 tablet, Rfl: 1   Allergies  Allergen Reactions   Penicillins Itching      The patient states she uses IUD for birth control. Last LMP was No LMP recorded (lmp unknown).  (Menstrual status: IUD).. Negative for Dysmenorrhea. Negative for: breast discharge, breast lump(s), breast pain and breast self exam. Associated symptoms include abnormal vaginal bleeding. Pertinent negatives include abnormal bleeding (hematology), anxiety, decreased libido, depression, difficulty falling sleep, dyspareunia, history of infertility, nocturia, sexual dysfunction, sleep disturbances, urinary incontinence, urinary urgency, vaginal discharge and vaginal itching. Diet regular.The patient states her exercise level is    . The patient's tobacco use is:  Social History   Tobacco Use  Smoking Status Never  Smokeless Tobacco Never  . She has been exposed to passive smoke. The patient's alcohol use is:  Social History   Substance and Sexual Activity  Alcohol Use No  . Additional information: Last pap 05/20/2021, next one scheduled for 2025.    Review of Systems  Constitutional: Negative.  Negative for chills and fever.  HENT:  Negative for congestion and sinus pressure.   Respiratory: Negative.  Negative for cough, chest tightness, shortness of breath and wheezing.   Cardiovascular: Negative.  Negative for chest pain and palpitations.  Gastrointestinal:  Negative for constipation, diarrhea and nausea.  Endocrine: Negative for polydipsia, polyphagia and polyuria.  Musculoskeletal:  Negative for arthralgias and myalgias.  Neurological: Negative.  Negative for dizziness, weakness and headaches.  Psychiatric/Behavioral: Negative.      Today's Vitals  05/20/21 1446  BP: 108/72  Pulse: 91  Temp: 98.1 F (36.7 C)  Weight: 210 lb 9.6 oz (95.5 kg)  Height: 5' 6" (1.676 m)   Body mass index is 33.99 kg/m.   Objective:  Physical Exam Vitals and nursing note reviewed. Exam conducted with a chaperone present.  Constitutional:      Appearance: Normal appearance. She is obese.  HENT:     Head: Normocephalic and atraumatic.     Right Ear: Tympanic membrane, ear canal and external  ear normal. There is no impacted cerumen.     Left Ear: Tympanic membrane, ear canal and external ear normal. There is no impacted cerumen.     Nose: Nose normal.     Mouth/Throat:     Mouth: Mucous membranes are moist.     Pharynx: Oropharynx is clear.  Eyes:     Extraocular Movements: Extraocular movements intact.     Conjunctiva/sclera: Conjunctivae normal.     Pupils: Pupils are equal, round, and reactive to light.  Cardiovascular:     Rate and Rhythm: Normal rate and regular rhythm.     Pulses: Normal pulses.     Heart sounds: Normal heart sounds. No murmur heard. Pulmonary:     Effort: Pulmonary effort is normal. No respiratory distress.     Breath sounds: Normal breath sounds. No wheezing.  Chest:  Breasts:    Tanner Score is 5.     Right: Normal.     Left: Normal.  Abdominal:     General: Abdomen is flat. Bowel sounds are normal.     Palpations: Abdomen is soft.  Genitourinary:    General: Normal vulva.     Vagina: No vaginal discharge.     Rectum: Normal. Guaiac result negative.     Comments: Pap smear today   Musculoskeletal:        General: Normal range of motion.     Cervical back: Normal range of motion and neck supple.  Skin:    General: Skin is warm and dry.  Neurological:     General: No focal deficit present.     Mental Status: She is alert and oriented to person, place, and time.  Psychiatric:        Mood and Affect: Mood normal.        Behavior: Behavior normal.        Assessment And Plan:     1. Encounter to establish care -Patient is here to establish care. Martin Majestic over patient medical, family, social and surgical history. -Reviewed with patient their medications and any allergies  -Reviewed with patient their sexual orientation, drug/tobacco and alcohol use -Dicussed any new concerns with patient  -recommended patient comes in for a physical exam and complete blood work.  -Educated patient about the importance of annual screenings and  immunizations.  -Advised patient to eat a healthy diet along with exercise for atleast 30-45 min atleast 4-5 days of the week.    2. Encounter for annual physical exam -Patient is here for their annual physical exam and we discussed any changes to medication and medical history.  -Behavior modification was discussed as well as diet and exercise history  -Patient will continue to exercise regularly and modify their diet.  -Recommendation for yearly physical annuals, immunization and screenings including mammogram and colonoscopy were discussed with the patient.  -Recommended intake of multivitamin, vitamin D and calcium.  -Individualized advise was given to the patient pertaining to their own health history in regards to diet,  exercise, medical condition and referrals.  - CMP14+EGFR - CBC - Hemoglobin A1c - Lipid panel - MM Digital Screening; Future - Hepatitis C antibody - HIV antibody (with reflex) - Cytology -Pap Smear  3. Vitamin D deficiency -Will check and supplement if needed. Advised patient to spend atleast 15 min. Daily in sunlight.  - Vitamin D (25 hydroxy)  4. Need for Tdap vaccination - Tdap vaccine greater than or equal to 7yo IM  5. Yeast vaginitis - Urine cytology ancillary only  6. 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 Increase the intake of lean meats including grilled chicken and [place].  Drink a lot of water. Decrease intake of fatty foods. Exercise for 30-45 min. 4-5 a week to decrease the risk of cardiac event.   Follow up: if symptoms persist or do not get better.   The patient was encouraged to call or send a message through [place] for any questions or concerns.   Side effects and appropriate use of all the medication(s) were discussed with the patient today. Patient advised to use the medication(s) as directed by  their healthcare provider. The patient was encouraged to read, review, and understand all associated package inserts and contact our office with any questions or concerns. The patient accepts the risks of the treatment plan and had an opportunity to ask questions.   Staying healthy and adopting a healthy lifestyle for your overall health is important. You should eat 7 or more servings of fruits and vegetables per day. You should drink plenty of water to keep yourself hydrated and your kidneys healthy. This includes about 65-80+ fluid ounces of water. Limit your intake of animal fats especially for elevated cholesterol. Avoid highly processed food and limit your salt intake if you have hypertension. Avoid foods high in saturated/Trans fats. Along with a healthy diet it is also very important to maintain time for yourself to maintain a healthy mental health with low stress levels. You should get atleast 150 min of moderate intensity exercise weekly for a healthy heart. Along with eating right and exercising, aim for at least 7-9 hours of sleep daily.  Eat more whole grains which includes barley, wheat berries, oats, brown rice and whole wheat pasta. Use healthy plant oils which include olive, soy, corn, sunflower and peanut. Limit your caffeine and sugary drinks. Limit your intake of fast foods. Limit milk and dairy products to one or two daily servings.   Patient was given opportunity to ask questions. Patient verbalized understanding of the plan and was able to repeat key elements of the plan. All questions were answered to their satisfaction.  Raman Ghumman, DNP   I, Raman Ghumman have reviewed all documentation for this visit. The documentation on 05/20/21 for the exam, diagnosis, procedures, and orders are all accurate and complete.    THE PATIENT IS ENCOURAGED TO PRACTICE SOCIAL DISTANCING DUE TO THE COVID-19 PANDEMIC.

## 2021-05-20 NOTE — Patient Instructions (Signed)
Health Maintenance, Female Adopting a healthy lifestyle and getting preventive care are important in promoting health and wellness. Ask your health care provider about: The right schedule for you to have regular tests and exams. Things you can do on your own to prevent diseases and keep yourself healthy. What should I know about diet, weight, and exercise? Eat a healthy diet  Eat a diet that includes plenty of vegetables, fruits, low-fat dairy products, and lean protein. Do not eat a lot of foods that are high in solid fats, added sugars, or sodium.  Maintain a healthy weight Body mass index (BMI) is used to identify weight problems. It estimates body fat based on height and weight. Your health care provider can help determineyour BMI and help you achieve or maintain a healthy weight. Get regular exercise Get regular exercise. This is one of the most important things you can do for your health. Most adults should: Exercise for at least 150 minutes each week. The exercise should increase your heart rate and make you sweat (moderate-intensity exercise). Do strengthening exercises at least twice a week. This is in addition to the moderate-intensity exercise. Spend less time sitting. Even light physical activity can be beneficial. Watch cholesterol and blood lipids Have your blood tested for lipids and cholesterol at 43 years of age, then havethis test every 5 years. Have your cholesterol levels checked more often if: Your lipid or cholesterol levels are high. You are older than 43 years of age. You are at high risk for heart disease. What should I know about cancer screening? Depending on your health history and family history, you may need to have cancer screening at various ages. This may include screening for: Breast cancer. Cervical cancer. Colorectal cancer. Skin cancer. Lung cancer. What should I know about heart disease, diabetes, and high blood pressure? Blood pressure and heart  disease High blood pressure causes heart disease and increases the risk of stroke. This is more likely to develop in people who have high blood pressure readings, are of African descent, or are overweight. Have your blood pressure checked: Every 3-5 years if you are 18-39 years of age. Every year if you are 40 years old or older. Diabetes Have regular diabetes screenings. This checks your fasting blood sugar level. Have the screening done: Once every three years after age 40 if you are at a normal weight and have a low risk for diabetes. More often and at a younger age if you are overweight or have a high risk for diabetes. What should I know about preventing infection? Hepatitis B If you have a higher risk for hepatitis B, you should be screened for this virus. Talk with your health care provider to find out if you are at risk forhepatitis B infection. Hepatitis C Testing is recommended for: Everyone born from 1945 through 1965. Anyone with known risk factors for hepatitis C. Sexually transmitted infections (STIs) Get screened for STIs, including gonorrhea and chlamydia, if: You are sexually active and are younger than 43 years of age. You are older than 43 years of age and your health care provider tells you that you are at risk for this type of infection. Your sexual activity has changed since you were last screened, and you are at increased risk for chlamydia or gonorrhea. Ask your health care provider if you are at risk. Ask your health care provider about whether you are at high risk for HIV. Your health care provider may recommend a prescription medicine to help   prevent HIV infection. If you choose to take medicine to prevent HIV, you should first get tested for HIV. You should then be tested every 3 months for as long as you are taking the medicine. Pregnancy If you are about to stop having your period (premenopausal) and you may become pregnant, seek counseling before you get  pregnant. Take 400 to 800 micrograms (mcg) of folic acid every day if you become pregnant. Ask for birth control (contraception) if you want to prevent pregnancy. Osteoporosis and menopause Osteoporosis is a disease in which the bones lose minerals and strength with aging. This can result in bone fractures. If you are 65 years old or older, or if you are at risk for osteoporosis and fractures, ask your health care provider if you should: Be screened for bone loss. Take a calcium or vitamin D supplement to lower your risk of fractures. Be given hormone replacement therapy (HRT) to treat symptoms of menopause. Follow these instructions at home: Lifestyle Do not use any products that contain nicotine or tobacco, such as cigarettes, e-cigarettes, and chewing tobacco. If you need help quitting, ask your health care provider. Do not use street drugs. Do not share needles. Ask your health care provider for help if you need support or information about quitting drugs. Alcohol use Do not drink alcohol if: Your health care provider tells you not to drink. You are pregnant, may be pregnant, or are planning to become pregnant. If you drink alcohol: Limit how much you use to 0-1 drink a day. Limit intake if you are breastfeeding. Be aware of how much alcohol is in your drink. In the [place] one drink equals one 12 oz bottle of beer (355 mL), one 5 oz glass of wine (148 mL), or one 1 oz glass of hard liquor (44 mL). General instructions Schedule regular health, dental, and eye exams. Stay current with your vaccines. Tell your health care provider if: You often feel depressed. You have ever been abused or do not feel safe at home. Summary Adopting a healthy lifestyle and getting preventive care are important in promoting health and wellness. Follow your health care provider's instructions about healthy diet, exercising, and getting tested or screened for diseases. Follow your health care provider's  instructions on monitoring your cholesterol and blood pressure. This information is not intended to replace advice given to you by your health care provider. Make sure you discuss any questions you have with your healthcare provider. Document Revised: 10/04/2018 Document Reviewed: 10/04/2018 Elsevier Patient Education  2022 Elsevier Inc.  

## 2021-05-21 LAB — CMP14+EGFR
ALT: 14 IU/L (ref 0–32)
AST: 22 IU/L (ref 0–40)
Albumin/Globulin Ratio: 1.6 (ref 1.2–2.2)
Albumin: 4.9 g/dL — ABNORMAL HIGH (ref 3.8–4.8)
Alkaline Phosphatase: 57 IU/L (ref 44–121)
BUN/Creatinine Ratio: 10 (ref 9–23)
BUN: 8 mg/dL (ref 6–24)
Bilirubin Total: 0.2 mg/dL (ref 0.0–1.2)
CO2: 23 mmol/L (ref 20–29)
Calcium: 9.6 mg/dL (ref 8.7–10.2)
Chloride: 99 mmol/L (ref 96–106)
Creatinine, Ser: 0.78 mg/dL (ref 0.57–1.00)
Globulin, Total: 3.1 g/dL (ref 1.5–4.5)
Glucose: 78 mg/dL (ref 65–99)
Potassium: 4 mmol/L (ref 3.5–5.2)
Sodium: 138 mmol/L (ref 134–144)
Total Protein: 8 g/dL (ref 6.0–8.5)
eGFR: 97 mL/min/{1.73_m2} (ref >59)

## 2021-05-21 LAB — LIPID PANEL
Chol/HDL Ratio: 2.8 ratio (ref 0.0–4.4)
Cholesterol, Total: 157 mg/dL (ref 100–199)
HDL: 56 mg/dL (ref >39)
LDL Chol Calc (NIH): 90 mg/dL (ref 0–99)
Triglycerides: 50 mg/dL (ref 0–149)
VLDL Cholesterol Cal: 11 mg/dL (ref 5–40)

## 2021-05-21 LAB — HEPATITIS C ANTIBODY: Hep C Virus Ab: <0.1 s/co ratio (ref 0.0–0.9)

## 2021-05-21 LAB — HEMOGLOBIN A1C
Est. average glucose Bld gHb Est-mCnc: 126 mg/dL
Hgb A1c MFr Bld: 6 % — ABNORMAL HIGH (ref 4.8–5.6)

## 2021-05-21 LAB — CBC
Hematocrit: 37 % (ref 34.0–46.6)
Hemoglobin: 12.1 g/dL (ref 11.1–15.9)
MCH: 26.7 pg (ref 26.6–33.0)
MCHC: 32.7 g/dL (ref 31.5–35.7)
MCV: 82 fL (ref 79–97)
Platelets: 322 10*3/uL (ref 150–450)
RBC: 4.53 x10E6/uL (ref 3.77–5.28)
RDW: 12.9 % (ref 11.7–15.4)
WBC: 5.5 10*3/uL (ref 3.4–10.8)

## 2021-05-21 LAB — HIV ANTIBODY (ROUTINE TESTING W REFLEX): HIV Screen 4th Generation wRfx: NONREACTIVE

## 2021-05-21 LAB — VITAMIN D 25 HYDROXY (VIT D DEFICIENCY, FRACTURES): Vit D, 25-Hydroxy: 32.5 ng/mL (ref 30.0–100.0)

## 2021-05-26 LAB — CYTOLOGY - PAP
Adequacy: ABSENT
Chlamydia: NEGATIVE
Comment: NEGATIVE
Comment: NEGATIVE
Comment: NEGATIVE
Comment: NORMAL
Diagnosis: UNDETERMINED — AB
High risk HPV: NEGATIVE
Neisseria Gonorrhea: NEGATIVE
Trichomonas: NEGATIVE

## 2021-11-19 IMAGING — MG MM DIGITAL DIAGNOSTIC UNILAT*R* W/ TOMO W/ CAD
6 series · 6 of 14 positions shown · non-contrast
Comparison: Previous exam(s).

CLINICAL DATA: Six-month follow-up for likely benign right breast
calcifications and a right breast mass.

EXAM:
DIGITAL DIAGNOSTIC UNILATERAL RIGHT MAMMOGRAM WITH CAD AND TOMO
RIGHT BREAST ULTRASOUND

[R ML]
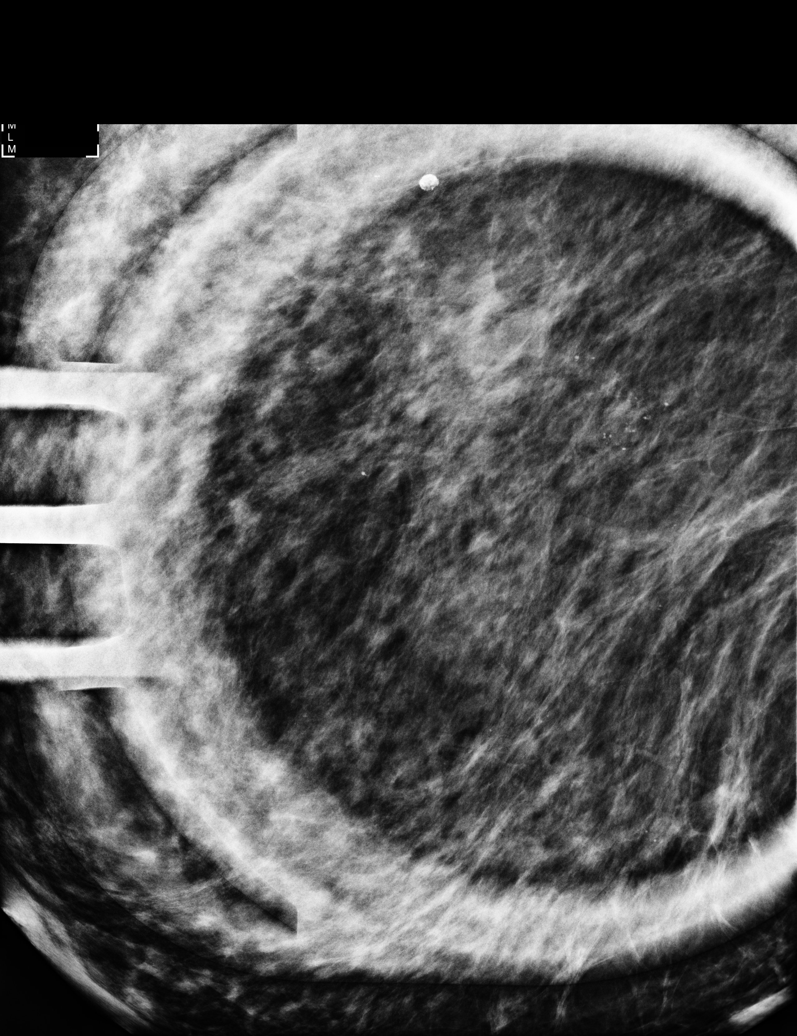

[R CC]
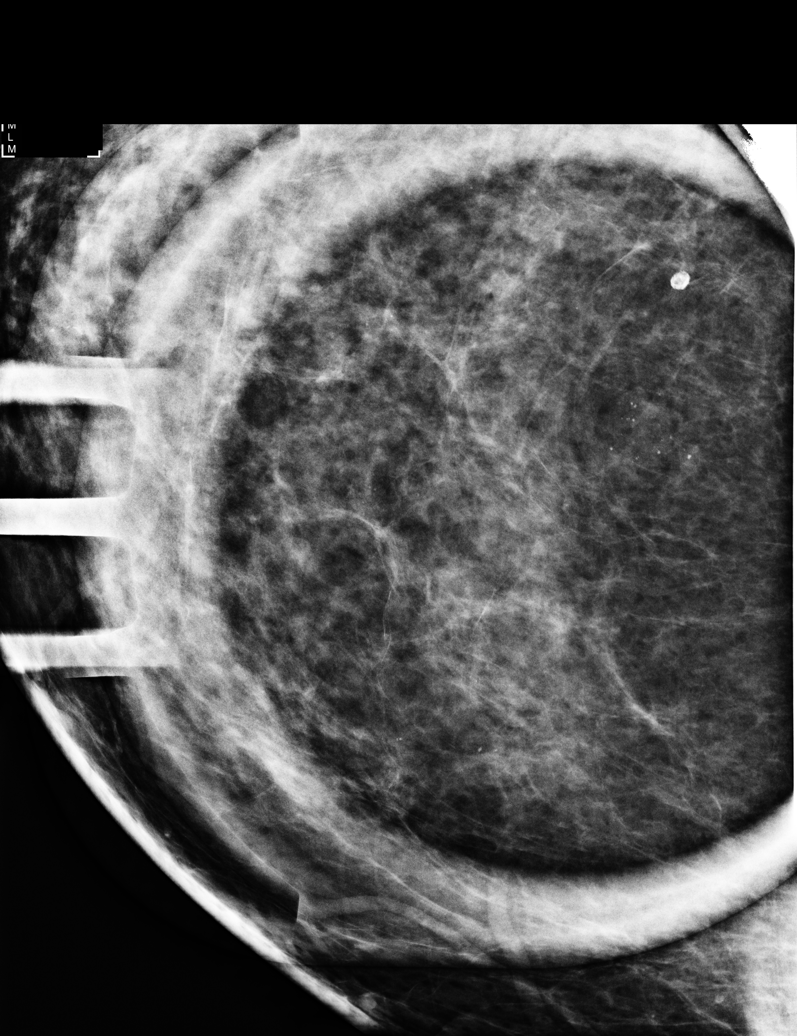

[R MLO synth-2D]
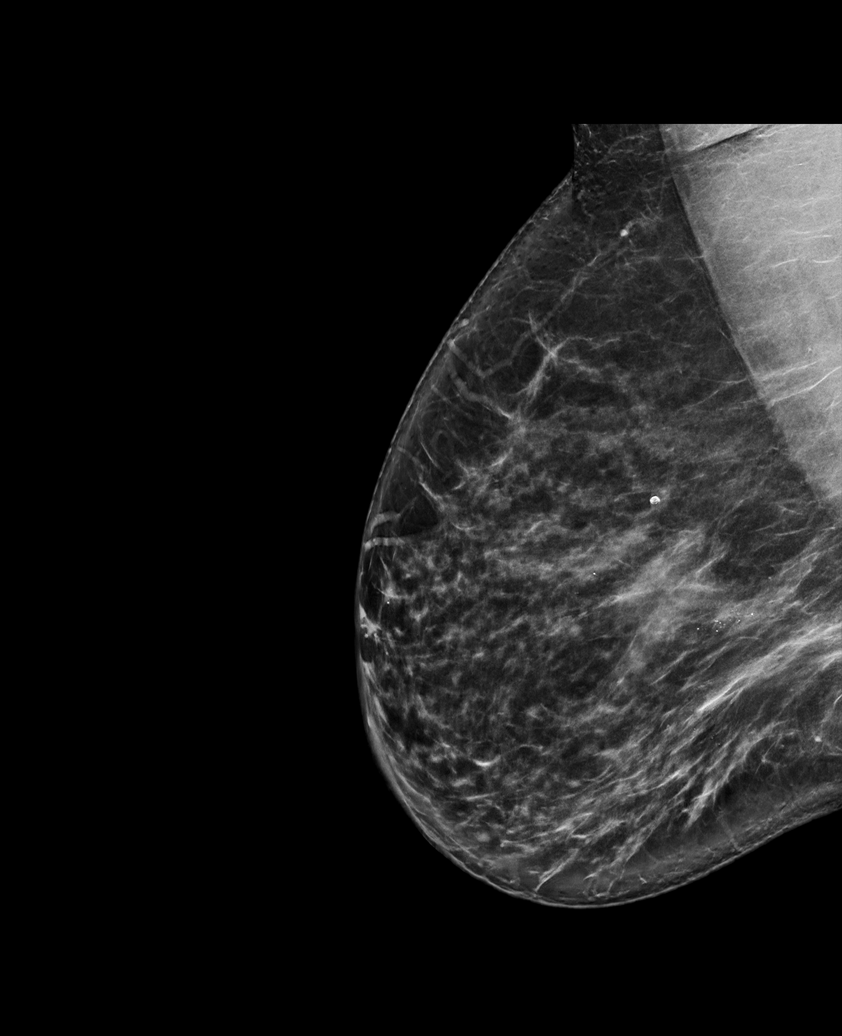

[R CC synth-2D]
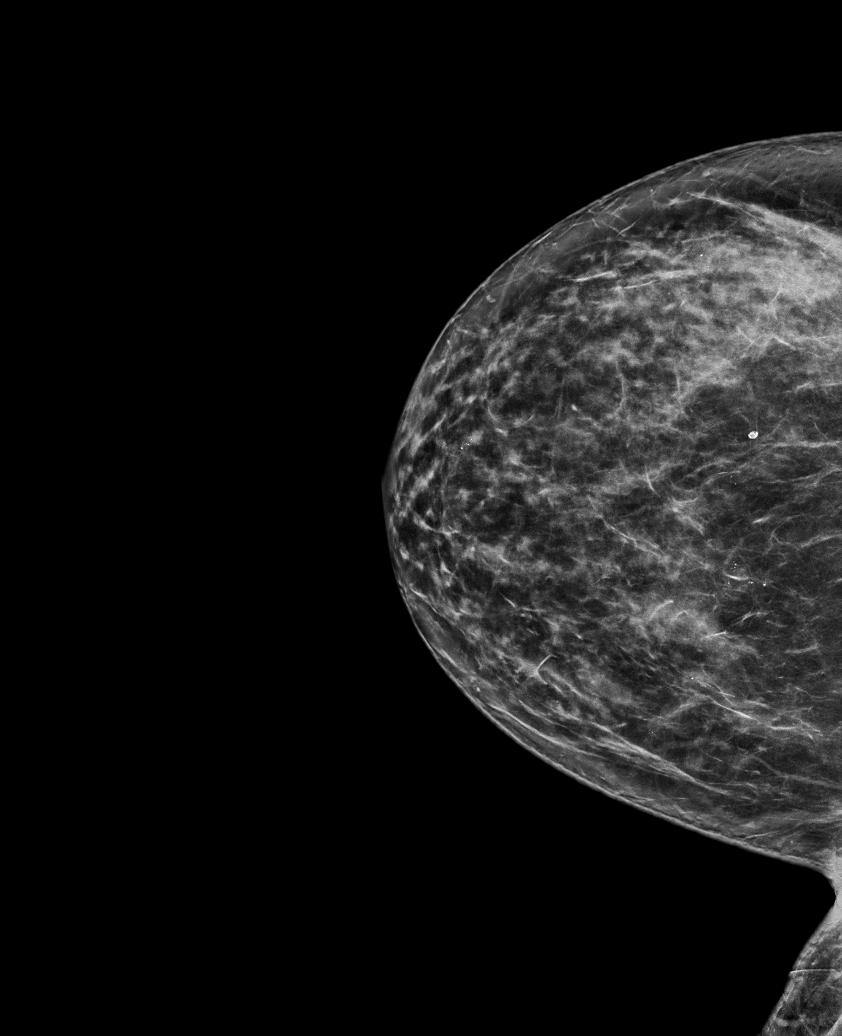

[R CC tomo · tomo slice 39/78.0]
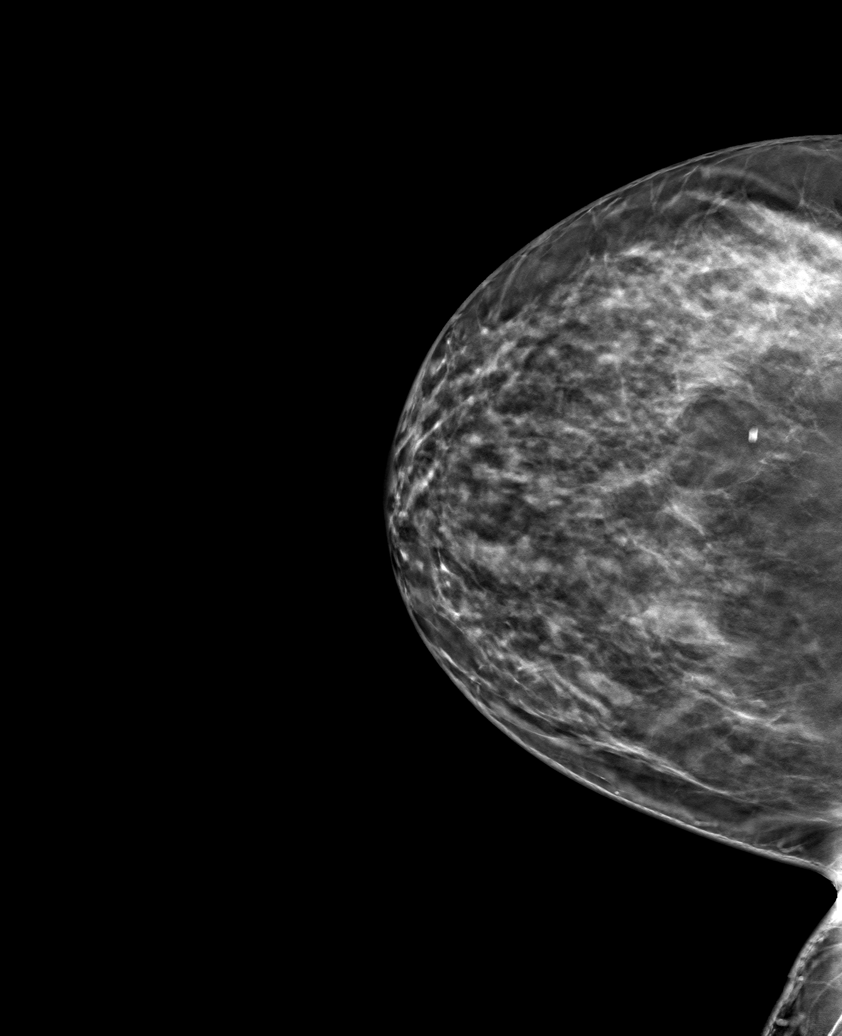

[R MLO tomo · tomo slice 45/88.0]
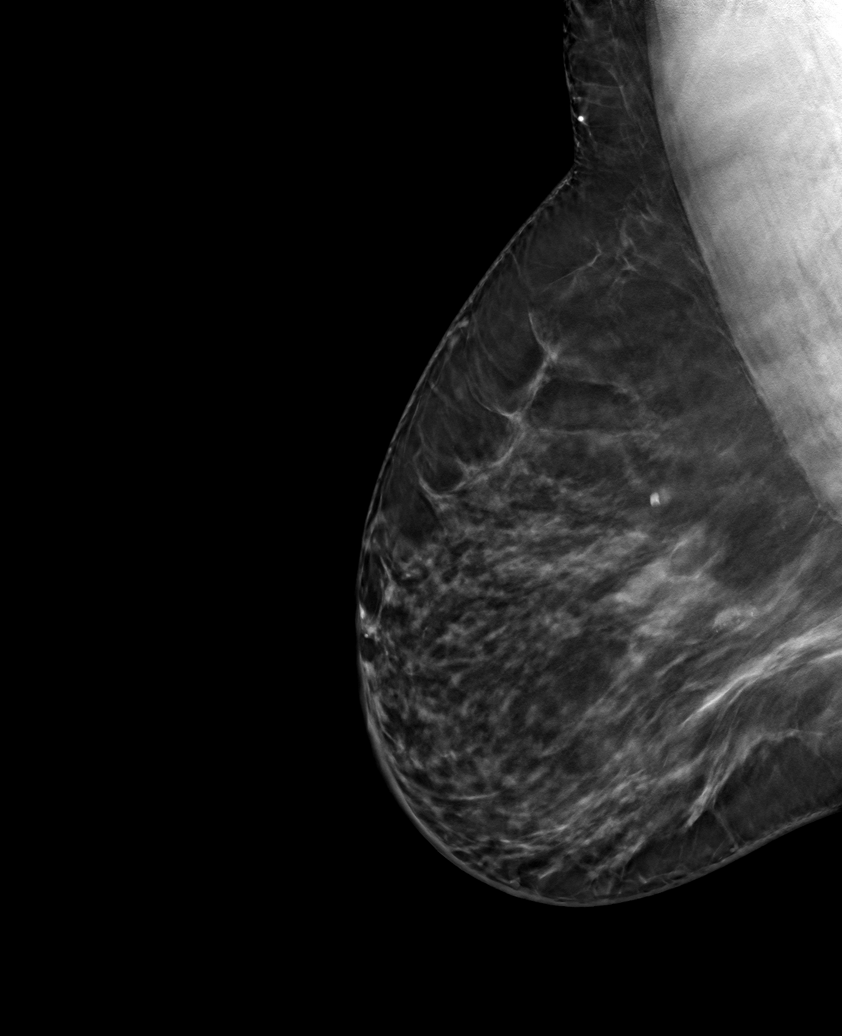

[6 of 14 positions shown; findings below may reference images not displayed]

ACR Breast Density Category c: The breast tissue is heterogeneously
dense, which may obscure small masses.
FINDINGS: The 1.2 cm group calcifications in the upper slightly inner quadrant
of the right breast are mammographically stable. No suspicious
calcifications, masses or areas of distortion are seen in the right
breast.

Mammographic images were processed with CAD.

Ultrasound targeted to the right breast at 2 o'clock, 5 cm from the
nipple demonstrates a stable oval hypoechoic mass measuring 1.2 x
0.3 x 0.7 cm, previously measuring 1.2 x 0.3 x 0.6 cm.
IMPRESSION: 1. The likely benign calcifications in the upper inner right breast
are stable.

2.  The likely benign right breast mass at 2 o'clock is stable.

RECOMMENDATION:
Bilateral diagnostic mammogram and right breast ultrasound in
Tuesday August, 2020.

I have discussed the findings and recommendations with the patient.
If applicable, a reminder letter will be sent to the patient
regarding the next appointment.

BI-RADS CATEGORY  3: Probably benign.

## 2021-12-02 ENCOUNTER — Ambulatory Visit: Payer: 59 | Admitting: Internal Medicine

## 2021-12-29 ENCOUNTER — Ambulatory Visit: Payer: 59 | Admitting: Nurse Practitioner

## 2022-03-05 DIAGNOSIS — H524 Presbyopia: Secondary | ICD-10-CM | POA: Diagnosis not present

## 2022-03-05 DIAGNOSIS — H5203 Hypermetropia, bilateral: Secondary | ICD-10-CM | POA: Diagnosis not present

## 2022-05-27 ENCOUNTER — Encounter: Payer: 59 | Admitting: Nurse Practitioner

## 2022-06-22 IMAGING — DX DG CHEST 2V
2 series · 2 of 2 positions shown · non-contrast
Comparison: Chest x-ray 09/06/2016.

CLINICAL DATA: Back pain with deep breath.  Shortness of breath.

EXAM:
CHEST - 2 VIEW

[chest pa]
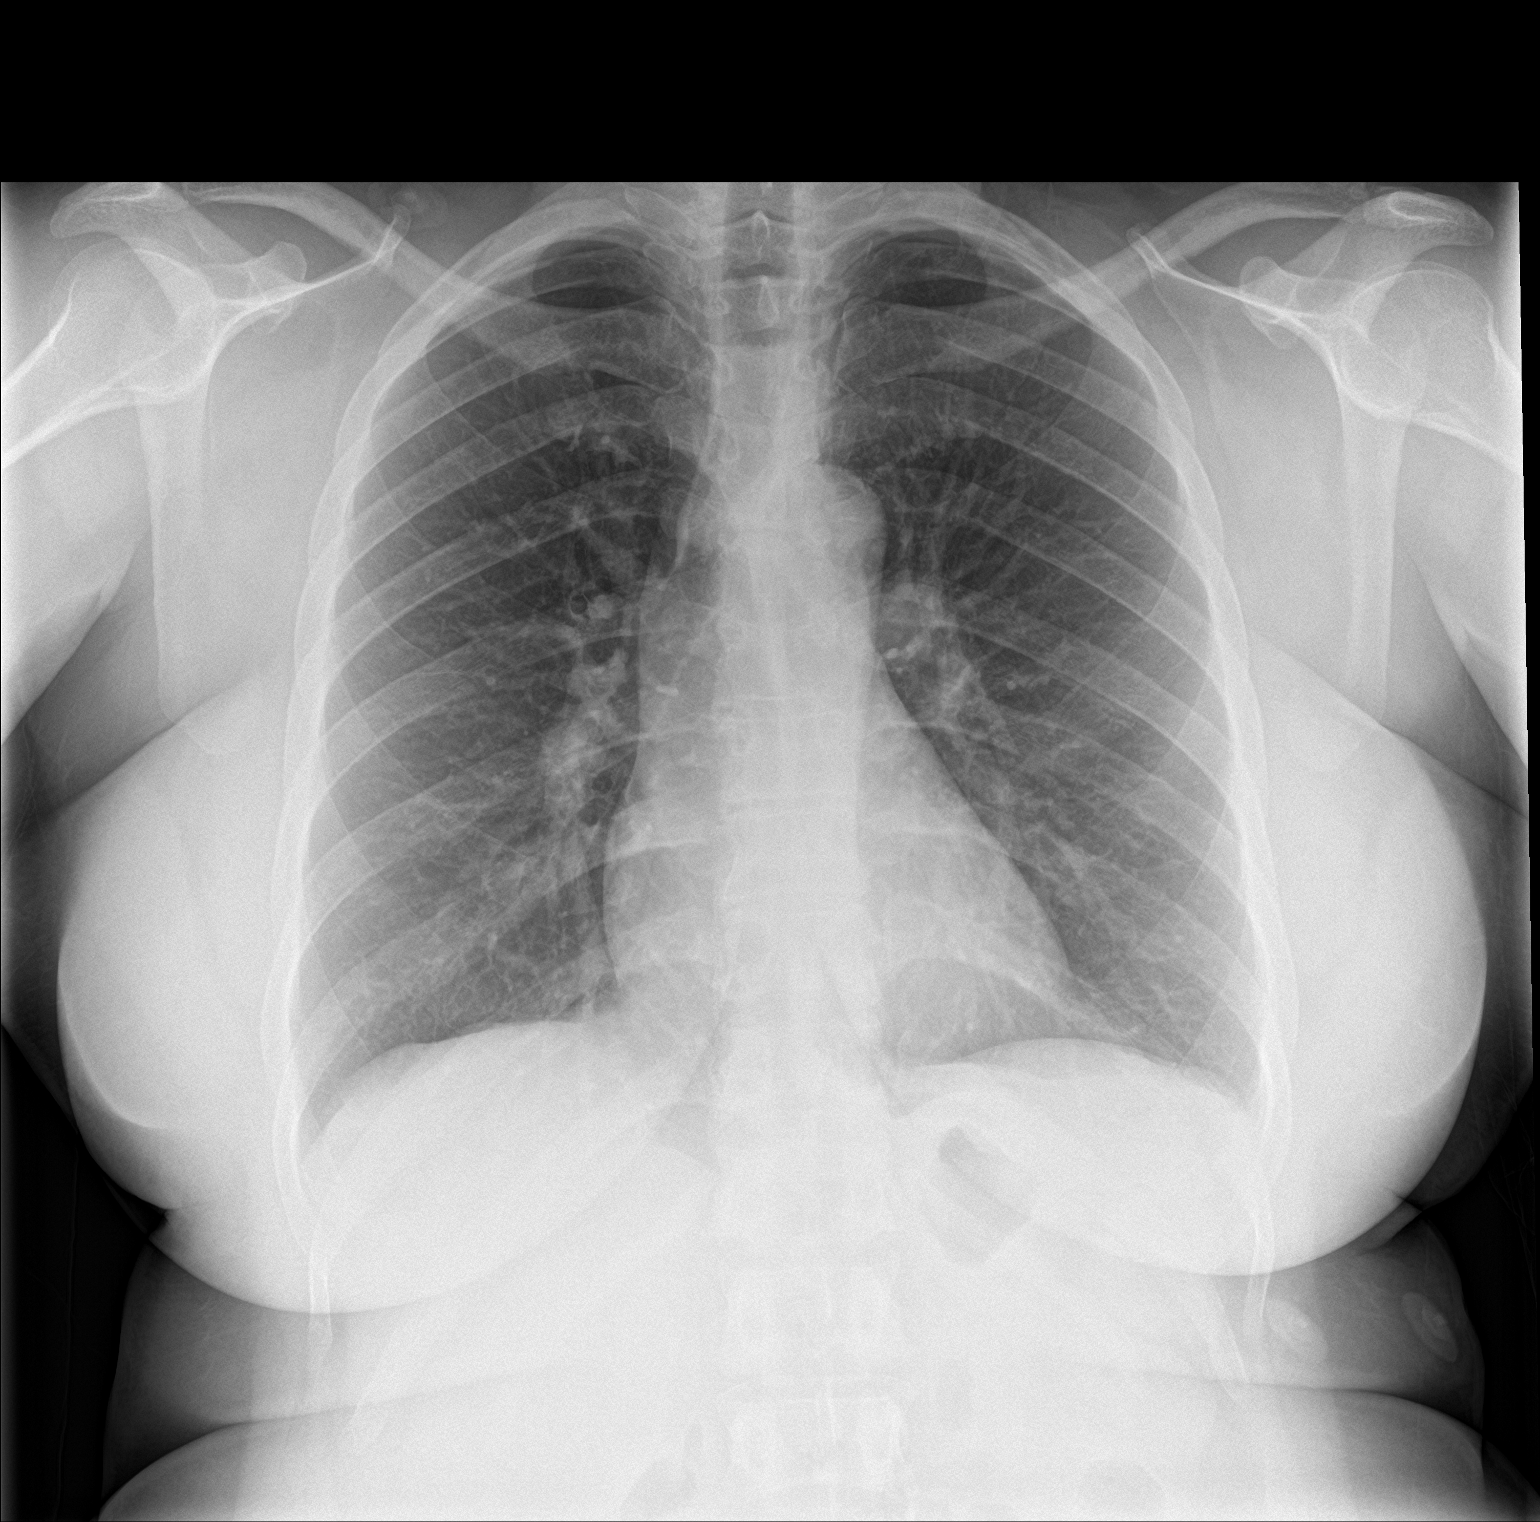

[chest lat]
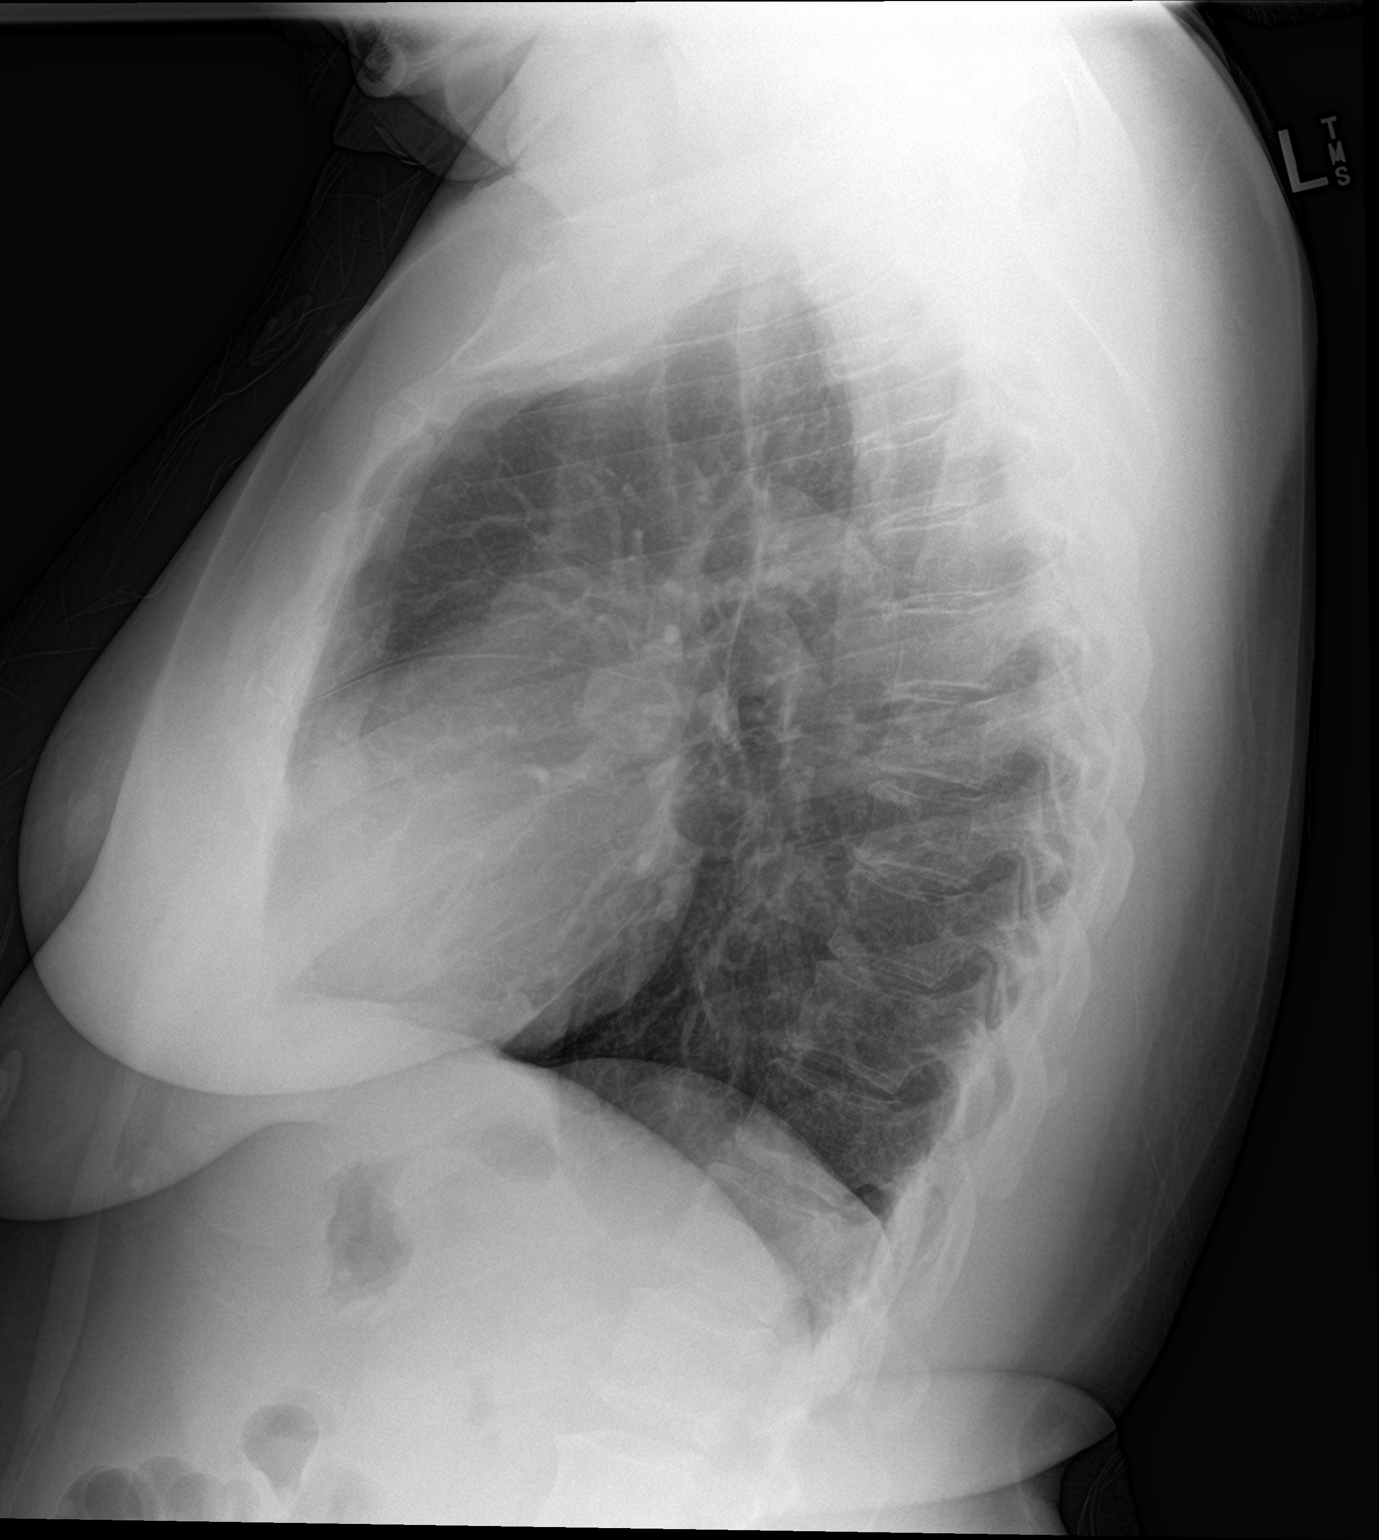

[2 of 2 positions shown; findings below may reference images not displayed]

FINDINGS: Mediastinum and hilar structures normal. Low lung volumes. No focal
alveolar infiltrate. Tiny left pleural effusion cannot be excluded.
No pneumothorax. Heart size normal. No acute bony abnormality.
IMPRESSION: Low lung volumes. No focal alveolar infiltrate. Tiny left pleural
effusion cannot be excluded.

## 2023-04-14 ENCOUNTER — Ambulatory Visit: Payer: 59 | Admitting: Nurse Practitioner

## 2023-04-15 ENCOUNTER — Other Ambulatory Visit: Payer: Self-pay | Admitting: Nurse Practitioner

## 2023-04-15 ENCOUNTER — Encounter: Payer: Self-pay | Admitting: Nurse Practitioner

## 2023-04-15 ENCOUNTER — Ambulatory Visit: Payer: 59 | Admitting: Nurse Practitioner

## 2023-04-15 ENCOUNTER — Ambulatory Visit: Payer: 59 | Attending: Nurse Practitioner

## 2023-04-15 VITALS — BP 100/70 | HR 84 | Temp 98.6°F | Ht 66.0 in | Wt 204.0 lb

## 2023-04-15 DIAGNOSIS — Z13220 Encounter for screening for lipoid disorders: Secondary | ICD-10-CM | POA: Diagnosis not present

## 2023-04-15 DIAGNOSIS — Z6832 Body mass index (BMI) 32.0-32.9, adult: Secondary | ICD-10-CM | POA: Diagnosis not present

## 2023-04-15 DIAGNOSIS — R42 Dizziness and giddiness: Secondary | ICD-10-CM

## 2023-04-15 DIAGNOSIS — E669 Obesity, unspecified: Secondary | ICD-10-CM | POA: Insufficient documentation

## 2023-04-15 DIAGNOSIS — Z136 Encounter for screening for cardiovascular disorders: Secondary | ICD-10-CM | POA: Diagnosis not present

## 2023-04-15 DIAGNOSIS — D509 Iron deficiency anemia, unspecified: Secondary | ICD-10-CM

## 2023-04-15 DIAGNOSIS — R002 Palpitations: Secondary | ICD-10-CM | POA: Diagnosis not present

## 2023-04-15 LAB — COMPREHENSIVE METABOLIC PANEL
ALT: 10 U/L (ref 0–35)
AST: 16 U/L (ref 0–37)
Albumin: 4 g/dL (ref 3.5–5.2)
Alkaline Phosphatase: 36 U/L — ABNORMAL LOW (ref 39–117)
BUN: 11 mg/dL (ref 6–23)
CO2: 27 mEq/L (ref 19–32)
Calcium: 9 mg/dL (ref 8.4–10.5)
Chloride: 102 mEq/L (ref 96–112)
Creatinine, Ser: 0.76 mg/dL (ref 0.40–1.20)
GFR: 94.83 mL/min (ref >60.00)
Glucose, Bld: 81 mg/dL (ref 70–99)
Potassium: 4 mEq/L (ref 3.5–5.1)
Sodium: 135 mEq/L (ref 135–145)
Total Bilirubin: 0.4 mg/dL (ref 0.2–1.2)
Total Protein: 7.4 g/dL (ref 6.0–8.3)

## 2023-04-15 LAB — TSH: TSH: 2.1 u[IU]/mL (ref 0.35–5.50)

## 2023-04-15 LAB — CBC
HCT: 29.7 % — ABNORMAL LOW (ref 36.0–46.0)
Hemoglobin: 8.9 g/dL — ABNORMAL LOW (ref 12.0–15.0)
MCHC: 30 g/dL (ref 30.0–36.0)
MCV: 66.7 fl — ABNORMAL LOW (ref 78.0–100.0)
Platelets: 377 10*3/uL (ref 150.0–400.0)
RBC: 4.46 Mil/uL (ref 3.87–5.11)
RDW: 20.1 % — ABNORMAL HIGH (ref 11.5–15.5)
WBC: 4.5 10*3/uL (ref 4.0–10.5)

## 2023-04-15 LAB — LIPID PANEL
Cholesterol: 153 mg/dL (ref 0–200)
HDL: 61.7 mg/dL (ref >39.00)
LDL Cholesterol: 81 mg/dL (ref 0–99)
NonHDL: 91.76
Total CHOL/HDL Ratio: 2
Triglycerides: 53 mg/dL (ref 0.0–149.0)
VLDL: 10.6 mg/dL (ref 0.0–40.0)

## 2023-04-15 LAB — HEMOGLOBIN A1C: Hgb A1c MFr Bld: 5.7 % (ref 4.6–6.5)

## 2023-04-15 LAB — FERRITIN: Ferritin: 5.3 ng/mL — ABNORMAL LOW (ref 10.0–291.0)

## 2023-04-15 LAB — IRON: Iron: 24 ug/dL — ABNORMAL LOW (ref 42–145)

## 2023-04-15 NOTE — Progress Notes (Unsigned)
Enrolled for [employer] to mail a ZIO XT long term holter monitor to the patients address on file.   DOD to read. 

## 2023-04-15 NOTE — Assessment & Plan Note (Signed)
History sounds consistent with orthostatic hypotension. For now patient encouraged to change positions slowly, drink at least 60 ounces of water a day, and consider trialing compression stockings.  Will also order labs to evaluate for other potential causes of dizziness.  Patient will undergo long-term heart monitor for about 7 days.  Patient to follow-up in 1 month for close monitoring, and to discuss results of testing.

## 2023-04-15 NOTE — Assessment & Plan Note (Signed)
Labs ordered today will also order ambulatory cardiac monitoring for 1 week for further evaluation.

## 2023-04-15 NOTE — Progress Notes (Signed)
New Patient Office Visit  Subjective    Patient ID: Erika Blair, female    DOB: 1976-10-18  Age: 47 y.o. MRN: [id number]  CC:  Chief Complaint  Patient presents with   New Patient (Initial Visit)    Wants a physical check up and having dizzy spells ( through out the day) vision blurry     HPI Erika Blair presents to establish care Last visit with PCP was approximately 2 years ago, and patient has returned to care as she is looking for a new PCP and feels that she is due for overall physical and evaluation. Her concern today is that she has been having intermittent dizzy spells for the last year.  They seem to be triggered with position changes especially standing.  She notices that after walking a lot she seems to be more prone to having these spells.  The spells last for few moments and then spontaneously resolved.  She also mentions she has had cardiac palpitations intermittently over the last year, they seem to be less frequently occurring lately.  She did experience palpitations earlier this week when she was at orientation for her daughter, she does report having drink caffeine that day.  She does report drinking a lot of water throughout the day.  Denies any chest pain or shortness of breath.  She also mentions momentary blurry vision when coming inside after being outside in the sun.  Does report seeing an eye doctor routinely, has not discussed this with her doctor as of yet.  Outpatient Encounter Medications as of 04/15/2023  Medication Sig   [DISCONTINUED] cetirizine (ZYRTEC) 10 MG tablet Take 1 tablet (10 mg total) by mouth daily.   [DISCONTINUED] fluconazole (DIFLUCAN) 150 MG tablet Take one tab now, then repeat in 3 days.   No facility-administered encounter medications on file as of 04/15/2023.    Past Medical History:  Diagnosis Date   Asthma    Chicken pox    Positive TB test     History reviewed. No pertinent surgical history.  Family History   Problem Relation Age of Onset   Diabetes Mother    Hypertension Sister    Diabetes Sister    Diabetes Brother     Social History   Socioeconomic History   Marital status: Married    Spouse name: Not on file   Number of children: Not on file   Years of education: Not on file   Highest education level: Not on file  Occupational History   Occupation: Public house manager: [employer]  Tobacco Use   Smoking status: Never   Smokeless tobacco: Never  Substance and Sexual Activity   Alcohol use: No   Drug use: No   Sexual activity: Yes    Birth control/protection: I.U.D.  Other Topics Concern   Not on file  Social History Narrative   Not on file   Social Determinants of Health   Financial Resource Strain: Not on file  Food Insecurity: Not on file  Transportation Needs: Not on file  Physical Activity: Not on file  Stress: Not on file  Social Connections: Not on file  Intimate Partner Violence: Not on file    Review of Systems  Constitutional:  Negative for fever, malaise/fatigue and weight loss.  Eyes:  Positive for blurred vision. Negative for double vision.  Respiratory:  Negative for cough and shortness of breath.   Cardiovascular:  Positive for palpitations. Negative for chest pain, leg swelling and  PND.  Gastrointestinal:  Negative for abdominal pain.  Neurological:  Positive for dizziness.        Objective    BP 100/70   Pulse 84   Temp 98.6 F (37 C) (Temporal)   Ht 5\' 6"  (1.676 m)   Wt 204 lb (92.5 kg)   SpO2 98%   BMI 32.93 kg/m   Physical Exam Vitals reviewed.  Constitutional:      General: She is not in acute distress.    Appearance: Normal appearance.  HENT:     Head: Normocephalic and atraumatic.  Neck:     Vascular: No carotid bruit.  Cardiovascular:     Rate and Rhythm: Normal rate and regular rhythm.     Pulses: Normal pulses.     Heart sounds: Normal heart sounds.  Pulmonary:     Effort: Pulmonary effort is normal.      Breath sounds: Normal breath sounds.  Skin:    General: Skin is warm and dry.  Neurological:     General: No focal deficit present.     Mental Status: She is alert and oriented to person, place, and time.  Psychiatric:        Mood and Affect: Mood normal.        Behavior: Behavior normal.        Judgment: Judgment normal.         Assessment & Plan:   Problem List Items Addressed This Visit       Other   Palpitations - Primary    Labs ordered today will also order ambulatory cardiac monitoring for 1 week for further evaluation.      Relevant Orders   CBC   Comprehensive metabolic panel   Hemoglobin A1c   Lipid panel   TSH   Iron   Ferritin   LONG TERM MONITOR (3-14 DAYS)   Dizziness    History sounds consistent with orthostatic hypotension. For now patient encouraged to change positions slowly, drink at least 60 ounces of water a day, and consider trialing compression stockings.  Will also order labs to evaluate for other potential causes of dizziness.  Patient will undergo long-term heart monitor for about 7 days.  Patient to follow-up in 1 month for close monitoring, and to discuss results of testing.      Relevant Orders   CBC   Comprehensive metabolic panel   Hemoglobin A1c   Lipid panel   TSH   Iron   Ferritin   LONG TERM MONITOR (3-14 DAYS)   Class 1 obesity without serious comorbidity with body mass index (BMI) of 32.0 to 32.9 in adult    Labs ordered, further recommendations may be made based upon his results       Relevant Orders   CBC   Comprehensive metabolic panel   Hemoglobin A1c   Lipid panel   TSH   Iron   Ferritin   LONG TERM MONITOR (3-14 DAYS)   Encounter for lipid screening for cardiovascular disease    Labs ordered, further recommendations may be made based upon his results       Relevant Orders   CBC   Comprehensive metabolic panel   Hemoglobin A1c   Lipid panel   TSH   Iron   Ferritin   LONG TERM MONITOR (3-14 DAYS)     Return in about 1 month (around 05/15/2023) for CPE with Maralyn Sago.   Elenore Paddy, NP

## 2023-04-15 NOTE — Assessment & Plan Note (Signed)
Labs ordered, further recommendations may be made based upon his results. 

## 2023-04-18 DIAGNOSIS — R002 Palpitations: Secondary | ICD-10-CM

## 2023-04-18 DIAGNOSIS — R42 Dizziness and giddiness: Secondary | ICD-10-CM | POA: Diagnosis not present

## 2023-04-21 ENCOUNTER — Telehealth: Payer: Self-pay | Admitting: Nurse Practitioner

## 2023-04-21 NOTE — Telephone Encounter (Signed)
Called pt, unable to reach her, send my chart message in regards to labs and also give [place] a call back for it

## 2023-04-21 NOTE — Telephone Encounter (Signed)
Patient called about her lab results. She would like a call back at [phone].

## 2023-05-04 DIAGNOSIS — R002 Palpitations: Secondary | ICD-10-CM | POA: Diagnosis not present

## 2023-05-04 DIAGNOSIS — R42 Dizziness and giddiness: Secondary | ICD-10-CM | POA: Diagnosis not present

## 2023-05-05 ENCOUNTER — Encounter: Payer: Self-pay | Admitting: Nurse Practitioner

## 2023-05-05 ENCOUNTER — Other Ambulatory Visit: Payer: Self-pay | Admitting: Nurse Practitioner

## 2023-05-05 DIAGNOSIS — D649 Anemia, unspecified: Secondary | ICD-10-CM

## 2023-05-05 NOTE — Telephone Encounter (Signed)
Sarah sent msg via [employer]. Closing encounter.Marland KitchenRaechel Chute

## 2023-06-03 ENCOUNTER — Ambulatory Visit: Payer: 59 | Admitting: Nurse Practitioner

## 2023-06-03 VITALS — BP 98/76 | HR 74 | Temp 98.0°F | Ht 66.0 in | Wt 212.5 lb

## 2023-06-03 DIAGNOSIS — Z1231 Encounter for screening mammogram for malignant neoplasm of breast: Secondary | ICD-10-CM

## 2023-06-03 DIAGNOSIS — Z0001 Encounter for general adult medical examination with abnormal findings: Secondary | ICD-10-CM

## 2023-06-03 DIAGNOSIS — N920 Excessive and frequent menstruation with regular cycle: Secondary | ICD-10-CM

## 2023-06-03 DIAGNOSIS — R7303 Prediabetes: Secondary | ICD-10-CM | POA: Diagnosis not present

## 2023-06-03 DIAGNOSIS — E611 Iron deficiency: Secondary | ICD-10-CM | POA: Diagnosis not present

## 2023-06-03 LAB — CBC
HCT: 38.3 % (ref 36.0–46.0)
Hemoglobin: 11.7 g/dL — ABNORMAL LOW (ref 12.0–15.0)
MCHC: 30.4 g/dL (ref 30.0–36.0)
MCV: 75.4 fl — ABNORMAL LOW (ref 78.0–100.0)
Platelets: 332 10*3/uL (ref 150.0–400.0)
RBC: 5.09 Mil/uL (ref 3.87–5.11)
RDW: 25.4 % — ABNORMAL HIGH (ref 11.5–15.5)
WBC: 3.9 10*3/uL — ABNORMAL LOW (ref 4.0–10.5)

## 2023-06-03 LAB — FERRITIN: Ferritin: 17.7 ng/mL (ref 10.0–291.0)

## 2023-06-03 LAB — IRON: Iron: 47 ug/dL (ref 42–145)

## 2023-06-03 NOTE — Assessment & Plan Note (Signed)
Patient was encouraged to consider colon cancer screening, she would prefer to hold off on this for now.  We did discuss different screening modalities.  Will discuss further at subsequent office visit. Screening mammogram ordered today. Discussed healthy lifestyle, handout provided.

## 2023-06-03 NOTE — Assessment & Plan Note (Signed)
Possibly due to thyroid per patient provided history of fibroid.  Check transvaginal ultrasound, refer back to OB/GYN if fibroid present.

## 2023-06-03 NOTE — Assessment & Plan Note (Signed)
Screening mammogram ordered, further recommendations may be made based upon these results. 

## 2023-06-03 NOTE — Progress Notes (Signed)
Established Patient Office Visit  Subjective   Patient ID: Erika Blair, female    DOB: 03/03/1976  Age: 47 y.o. MRN: [id number]  Chief Complaint  Patient presents with   Annual Exam    Patient has today for annual physical exam as well as to follow-up regarding her anemia.  Health maintenance: Due for colonoscopy, next Pap smear due July 2025, up-to-date with tetanus, has completed hep C and HIV screenings.  Due for mammogram.  Anemia/Dizziness: Last CBC identified hemoglobin of 8.9.  I had referred her to hematology for further evaluation and to see if she would benefit from IV iron infusions.  She was unreachable.  She does report that she started an over-the-counter iron supplement and notices that her dizziness is improved.  She mentions she has been having some heavy menstrual bleeding with cycles but not each cycle is heavy.  She had also mentioned cardiac palpitations and underwent cardiac monitor that she kept on for about 4 days this did not identify any abnormal underlying rhythms.  She reports palpitations have been a bit better since starting iron as well.  She vaguely remembers being told she has uterine fibroid, does not follow with OB/GYN routinely and has not seen them in a little while.    Review of Systems  Constitutional:  Negative for chills, fever, malaise/fatigue and weight loss.  Respiratory:  Negative for cough and shortness of breath.   Cardiovascular:  Positive for palpitations. Negative for chest pain.  Gastrointestinal:  Negative for abdominal pain and blood in stool.  Genitourinary:  Negative for hematuria.  Neurological:  Positive for dizziness. Negative for seizures, loss of consciousness and headaches.  Psychiatric/Behavioral:  Negative for suicidal ideas. The patient does not have insomnia.       Objective:     BP 98/76   Pulse 74   Temp 98 F (36.7 C) (Temporal)   Ht 5\' 6"  (1.676 m)   Wt 212 lb 8 oz (96.4 kg)   SpO2 96%   BMI 34.30  kg/m  BP Readings from Last 3 Encounters:  06/03/23 98/76  04/15/23 100/70  05/20/21 108/72   Wt Readings from Last 3 Encounters:  06/03/23 212 lb 8 oz (96.4 kg)  04/15/23 204 lb (92.5 kg)  05/20/21 210 lb 9.6 oz (95.5 kg)      Physical Exam Vitals reviewed. Exam conducted with a chaperone present.  Constitutional:      Appearance: Normal appearance.  HENT:     Head: Normocephalic and atraumatic.     Right Ear: Tympanic membrane, ear canal and external ear normal.     Left Ear: Tympanic membrane, ear canal and external ear normal.  Eyes:     General:        Right eye: No discharge.        Left eye: No discharge.     Extraocular Movements: Extraocular movements intact.     Conjunctiva/sclera: Conjunctivae normal.     Pupils: Pupils are equal, round, and reactive to light.  Neck:     Vascular: No carotid bruit.  Cardiovascular:     Rate and Rhythm: Normal rate and regular rhythm.     Pulses: Normal pulses.     Heart sounds: Normal heart sounds. No murmur heard. Pulmonary:     Effort: Pulmonary effort is normal.     Breath sounds: Normal breath sounds.  Chest:  Breasts:    Breasts are symmetrical.     Right: Normal.     Left:  Normal.  Abdominal:     General: Abdomen is flat. Bowel sounds are normal. There is no distension.     Palpations: Abdomen is soft. There is no mass.     Tenderness: There is no abdominal tenderness.  Musculoskeletal:        General: No tenderness.     Cervical back: Neck supple. No muscular tenderness.     Right lower leg: No edema.     Left lower leg: No edema.  Lymphadenopathy:     Cervical: No cervical adenopathy.     Upper Body:     Right upper body: No supraclavicular adenopathy.     Left upper body: No supraclavicular adenopathy.  Skin:    General: Skin is warm and dry.  Neurological:     General: No focal deficit present.     Mental Status: She is alert and oriented to person, place, and time.     Motor: No weakness.     Gait:  Gait normal.  Psychiatric:        Mood and Affect: Mood normal.        Behavior: Behavior normal.        Judgment: Judgment normal.      No results found for any visits on 06/03/23.    The 10-year ASCVD risk score (Arnett DK, et al., 2019) is: 0.2%    Assessment & Plan:   Problem List Items Addressed This Visit       Other   Iron deficiency    Etiology clear, however favor vaginal bleeding as cause. Will order repeat labs to determine whether blood counts have stabilized and whether or not she is responding to the iron supplement.  Also check for evidence of hemolytic anemia and bone marrow response with reticulocyte count.  I will refer to hematology in anticipation of possible iron infusions.  Will order transvaginal ultrasound of pelvis, if fibroid is noted will refer back to OB/GYN to determine treatment options.      Relevant Orders   CBC   Ambulatory referral to Hematology / Oncology   Pathologist smear review   Reticulocytes   Lactate dehydrogenase   Iron   Ferritin   Menorrhagia with regular cycle    Possibly due to thyroid per patient provided history of fibroid.  Check transvaginal ultrasound, refer back to OB/GYN if fibroid present.      Relevant Orders   US PELVIC COMPLETE WITH TRANSVAGINAL   Encounter for screening mammogram for malignant neoplasm of breast    Screening mammogram ordered, further recommendations may be made based upon these results.      Relevant Orders   MM DIGITAL SCREENING BILATERAL   Encounter for general adult medical examination with abnormal findings - Primary    Patient was encouraged to consider colon cancer screening, she would prefer to hold off on this for now.  We did discuss different screening modalities.  Will discuss further at subsequent office visit. Screening mammogram ordered today. Discussed healthy lifestyle, handout provided.      Prediabetes    Noted on last lab results.  We did discuss the importance of  following a healthy diet focusing mainly on whole foods including vegetables, fruits, lean proteins, and whole grains.  Encouraged her to avoid sodas, fried foods, excessive starchy foods.  Patient reports understanding.  Also encouraged her to exercise 150 minutes/week.  Patient reports understanding.       Return in about 3 months (around 09/03/2023) for 3-6 months F/U with Maralyn Sago.  Elenore Paddy, NP

## 2023-06-03 NOTE — Assessment & Plan Note (Signed)
Etiology clear, however favor vaginal bleeding as cause. Will order repeat labs to determine whether blood counts have stabilized and whether or not she is responding to the iron supplement.  Also check for evidence of hemolytic anemia and bone marrow response with reticulocyte count.  I will refer to hematology in anticipation of possible iron infusions.  Will order transvaginal ultrasound of pelvis, if fibroid is noted will refer back to OB/GYN to determine treatment options.

## 2023-06-03 NOTE — Assessment & Plan Note (Signed)
Noted on last lab results.  We did discuss the importance of following a healthy diet focusing mainly on whole foods including vegetables, fruits, lean proteins, and whole grains.  Encouraged her to avoid sodas, fried foods, excessive starchy foods.  Patient reports understanding.  Also encouraged her to exercise 150 minutes/week.  Patient reports understanding.

## 2023-06-06 ENCOUNTER — Other Ambulatory Visit (HOSPITAL_COMMUNITY): Payer: Self-pay

## 2023-06-25 ENCOUNTER — Inpatient Hospital Stay: Payer: 59

## 2023-06-25 ENCOUNTER — Encounter: Payer: Self-pay | Admitting: Internal Medicine

## 2023-06-25 ENCOUNTER — Inpatient Hospital Stay: Payer: 59 | Attending: Internal Medicine | Admitting: Internal Medicine

## 2023-06-25 ENCOUNTER — Other Ambulatory Visit: Payer: Self-pay

## 2023-06-25 ENCOUNTER — Other Ambulatory Visit (HOSPITAL_COMMUNITY): Payer: Self-pay

## 2023-06-25 ENCOUNTER — Other Ambulatory Visit: Payer: Self-pay | Admitting: Internal Medicine

## 2023-06-25 VITALS — BP 108/77 | HR 78 | Temp 97.8°F | Resp 14

## 2023-06-25 DIAGNOSIS — J45909 Unspecified asthma, uncomplicated: Secondary | ICD-10-CM | POA: Insufficient documentation

## 2023-06-25 DIAGNOSIS — D500 Iron deficiency anemia secondary to blood loss (chronic): Secondary | ICD-10-CM

## 2023-06-25 DIAGNOSIS — N920 Excessive and frequent menstruation with regular cycle: Secondary | ICD-10-CM | POA: Diagnosis not present

## 2023-06-25 DIAGNOSIS — E611 Iron deficiency: Secondary | ICD-10-CM

## 2023-06-25 DIAGNOSIS — D259 Leiomyoma of uterus, unspecified: Secondary | ICD-10-CM

## 2023-06-25 DIAGNOSIS — D539 Nutritional anemia, unspecified: Secondary | ICD-10-CM

## 2023-06-25 LAB — CBC WITH DIFFERENTIAL (CANCER CENTER ONLY)
Abs Immature Granulocytes: 0.01 10*3/uL (ref 0.00–0.07)
Basophils Absolute: 0 10*3/uL (ref 0.0–0.1)
Basophils Relative: 1 %
Eosinophils Absolute: 0.2 10*3/uL (ref 0.0–0.5)
Eosinophils Relative: 6 %
HCT: 38.1 % (ref 36.0–46.0)
Hemoglobin: 11.8 g/dL — ABNORMAL LOW (ref 12.0–15.0)
Immature Granulocytes: 0 %
Lymphocytes Relative: 34 %
Lymphs Abs: 1.4 10*3/uL (ref 0.7–4.0)
MCH: 24.1 pg — ABNORMAL LOW (ref 26.0–34.0)
MCHC: 31 g/dL (ref 30.0–36.0)
MCV: 77.8 fL — ABNORMAL LOW (ref 80.0–100.0)
Monocytes Absolute: 0.4 10*3/uL (ref 0.1–1.0)
Monocytes Relative: 9 %
Neutro Abs: 2 10*3/uL (ref 1.7–7.7)
Neutrophils Relative %: 50 %
Platelet Count: 305 10*3/uL (ref 150–400)
RBC: 4.9 MIL/uL (ref 3.87–5.11)
RDW: 19.8 % — ABNORMAL HIGH (ref 11.5–15.5)
WBC Count: 4 10*3/uL (ref 4.0–10.5)
nRBC: 0 % (ref 0.0–0.2)

## 2023-06-25 LAB — CMP (CANCER CENTER ONLY)
ALT: 11 U/L (ref 0–44)
AST: 17 U/L (ref 15–41)
Albumin: 4.1 g/dL (ref 3.5–5.0)
Alkaline Phosphatase: 40 U/L (ref 38–126)
Anion gap: 5 (ref 5–15)
BUN: 16 mg/dL (ref 6–20)
CO2: 25 mmol/L (ref 22–32)
Calcium: 9 mg/dL (ref 8.9–10.3)
Chloride: 106 mmol/L (ref 98–111)
Creatinine: 0.8 mg/dL (ref 0.44–1.00)
GFR, Estimated: >60 mL/min (ref >60)
Glucose, Bld: 94 mg/dL (ref 70–99)
Potassium: 4 mmol/L (ref 3.5–5.1)
Sodium: 136 mmol/L (ref 135–145)
Total Bilirubin: 0.3 mg/dL (ref 0.3–1.2)
Total Protein: 7.3 g/dL (ref 6.5–8.1)

## 2023-06-25 LAB — IRON AND IRON BINDING CAPACITY (CC-WL,HP ONLY)
Iron: 41 ug/dL (ref 28–170)
Saturation Ratios: 10 % — ABNORMAL LOW (ref 10.4–31.8)
TIBC: 410 ug/dL (ref 250–450)
UIBC: 369 ug/dL (ref 148–442)

## 2023-06-25 LAB — LACTATE DEHYDROGENASE: LDH: 215 U/L — ABNORMAL HIGH (ref 98–192)

## 2023-06-25 LAB — VITAMIN B12: Vitamin B-12: 381 pg/mL (ref 180–914)

## 2023-06-25 LAB — TSH: TSH: 1.648 u[IU]/mL (ref 0.350–4.500)

## 2023-06-25 LAB — FOLATE: Folate: 16.7 ng/mL (ref >5.9)

## 2023-06-25 LAB — FERRITIN: Ferritin: 18 ng/mL (ref 11–307)

## 2023-06-25 MED ORDER — INTEGRA PLUS PO CAPS
1.0000 | ORAL_CAPSULE | Freq: Every day | ORAL | 3 refills | Status: DC
Start: 2023-06-25 — End: 2024-03-01
  Filled 2023-06-25 – 2023-07-15 (×2): qty 30, 30d supply, fill #0

## 2023-06-25 NOTE — Progress Notes (Signed)
[facility] [phone]   [phone]  CONSULT NOTE  REFERRING PHYSICIAN: Jiles Prows, NP  REASON FOR CONSULTATION:  47 years old African-American female with anemia.  HPI Erika Blair is a 47 y.o. female with past medical history significant for asthma, chickenpox as well as uterine fibroid.  The patient was seen recently by her primary care provider for routine evaluation and she has been complaining of increasing fatigue and weakness.  She also had heavy menstrual cycles that usually last for around 6 days with 4 days of heavy bleeding and clots.  She has a history of uterine fibroid during her delivery many years ago but this was not an issue was at that time.  She currently has IUD.  She started taking over-the-counter iron tablets 1 daily basis since June 2024.  She is feeling a little bit better since starting taking the iron tablets. She was referred to me today for evaluation and recommendation regarding her anemia. When seen today she has no concerning complaints except for the heavy menstrual bleeding.  She is scheduled for pelvic ultrasound for evaluation of her uterine fibroid on 06/29/2023. The patient has no concerning fatigue or weakness.  She used to have dizzy spells before starting the iron tablet but this is improved she has no chest pain, shortness of breath, cough or hemoptysis.  She has no nausea, vomiting, diarrhea or constipation.  She has no headache but has some recent visual changes.  She has no recent weight loss or night sweats. Family history significant for mother, sister and brother with diabetes.  Father died from old age. The patient is married and has 2 children.  She is originally from [place].  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  She has no history of smoking, alcohol or drug abuse.  HPI  Past Medical History:  Diagnosis Date   Asthma    Chicken pox    Positive TB test     Uterine fibroid     No past surgical history on file.  Family History  Problem Relation Age of Onset   Diabetes Mother    Hypertension Sister    Diabetes Sister    Diabetes Brother     Social History Social History   Tobacco Use   Smoking status: Never   Smokeless tobacco: Never  Substance Use Topics   Alcohol use: No   Drug use: No    Allergies  Allergen Reactions   Chloroquine Itching   Penicillins Itching    Current Outpatient Medications  Medication Sig Dispense Refill   Ferrous Sulfate (IRON PO) Take by mouth.     No current facility-administered medications for this visit.    Review of Systems  Constitutional: negative Eyes: negative Ears, nose, mouth, throat, and face: negative Respiratory: negative Cardiovascular: negative Gastrointestinal: negative Genitourinary:positive for heavy menstruation Integument/breast: negative Hematologic/lymphatic: negative Musculoskeletal:negative Neurological: negative Behavioral/Psych: negative Endocrine: negative Allergic/Immunologic: negative  Physical Exam  GEX:BMWUX, healthy, no distress, well nourished, and well developed SKIN: skin color, texture, turgor are normal, no rashes or significant lesions HEAD: Normocephalic, No masses, lesions, tenderness or abnormalities EYES: normal, PERRLA, Conjunctiva are pink and non-injected EARS: External ears normal, Canals clear OROPHARYNX:no exudate, no erythema, and lips, buccal mucosa, and tongue normal  NECK: supple, no adenopathy, no JVD LYMPH:  no palpable lymphadenopathy, no hepatosplenomegaly BREAST:not examined LUNGS: clear to auscultation , and palpation HEART: regular rate & rhythm, no murmurs, and no  gallops ABDOMEN:abdomen soft, non-tender, normal bowel sounds, and no masses or organomegaly BACK: Back symmetric, no curvature., No CVA tenderness EXTREMITIES:no joint deformities, effusion, or inflammation, no edema  NEURO: alert & oriented x 3 with  fluent speech, no focal motor/sensory deficits  PERFORMANCE STATUS: ECOG 0  LABORATORY DATA: Lab Results  Component Value Date   WBC 3.9 (L) 06/03/2023   HGB 11.7 (L) 06/03/2023   HCT 38.3 06/03/2023   MCV 75.4 (L) 06/03/2023   PLT 332.0 06/03/2023      Chemistry      Component Value Date/Time   NA 135 04/15/2023 1007   NA 138 05/20/2021 1520   K 4.0 04/15/2023 1007   CL 102 04/15/2023 1007   CO2 27 04/15/2023 1007   BUN 11 04/15/2023 1007   BUN 8 05/20/2021 1520   CREATININE 0.76 04/15/2023 1007      Component Value Date/Time   CALCIUM 9.0 04/15/2023 1007   ALKPHOS 36 (L) 04/15/2023 1007   AST 16 04/15/2023 1007   ALT 10 04/15/2023 1007   BILITOT 0.4 04/15/2023 1007   BILITOT 0.2 05/20/2021 1520       RADIOGRAPHIC STUDIES: No results found.  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   PLAN: I had a lengthy discussion with the patient today about her current condition and treatment options. I ordered several studies today for evaluation of her anemia including repeat CBC that showed hemoglobin of 11.8 and hematocrit 38.1% with MCV of 77.8.  Iron study, ferritin, serum folate, vitamin B12, TSH, LDH as well as serum protein electrophoresis are still pending. The patient had improvement in her condition after starting the over-the-counter ferrous sulfate. I recommended for the patient to continue on the oral iron supplement and I will give her prescription for Integra +1 capsule p.o. daily.  She was advised to take it with vitamin C or orange juice. If she has severe iron deficiency and may consider her for iron infusion but the patient still preferred to continue on the oral iron tablets for now. For the uterine fibroid she is scheduled to have pelvic ultrasound next week and hopefully discussion with gynecology regarding  management of her uterine fibroid to help with the excessive menstrual hemorrhage. I also recommend for the patient to have referral from her primary care provider to gastroenterology for consideration for screening colonoscopy. I will see her back for follow-up visit in 3 months for evaluation and repeat blood work. She was advised to call immediately if she has any other concerning symptoms in the interval. The patient voices understanding of current disease status and treatment options and is in agreement with the current care plan.  All questions were answered. The patient knows to call the clinic with any problems, questions or concerns. We can certainly see the patient much sooner if necessary.  Thank you so much for allowing me to participate in the care of Erika Blair. I will continue to follow up the patient with you and assist in her care.  The total time spent in the appointment was 60 minutes.  Disclaimer: This note was dictated with voice recognition software. Similar sounding words can inadvertently be transcribed and may not be corrected upon review.   Lajuana Matte June 25, 2023, 10:42 AM

## 2023-06-28 ENCOUNTER — Other Ambulatory Visit (HOSPITAL_COMMUNITY): Payer: Self-pay

## 2023-06-29 ENCOUNTER — Ambulatory Visit
Admission: RE | Admit: 2023-06-29 | Discharge: 2023-06-29 | Disposition: A | Discharge status: Home / Self Care | Payer: 59 | Source: Ambulatory Visit | Attending: Nurse Practitioner | Admitting: Nurse Practitioner

## 2023-06-29 DIAGNOSIS — N920 Excessive and frequent menstruation with regular cycle: Secondary | ICD-10-CM

## 2023-06-29 DIAGNOSIS — D251 Intramural leiomyoma of uterus: Secondary | ICD-10-CM | POA: Diagnosis not present

## 2023-06-29 LAB — PROTEIN ELECTROPHORESIS, SERUM, WITH REFLEX
A/G Ratio: 1.2 (ref 0.7–1.7)
Albumin ELP: 3.8 g/dL (ref 2.9–4.4)
Alpha-1-Globulin: 0.2 g/dL (ref 0.0–0.4)
Alpha-2-Globulin: 0.5 g/dL (ref 0.4–1.0)
Beta Globulin: 1.1 g/dL (ref 0.7–1.3)
Gamma Globulin: 1.5 g/dL (ref 0.4–1.8)
Globulin, Total: 3.3 g/dL (ref 2.2–3.9)
Total Protein ELP: 7.1 g/dL (ref 6.0–8.5)

## 2023-07-07 ENCOUNTER — Other Ambulatory Visit (HOSPITAL_COMMUNITY): Payer: Self-pay

## 2023-07-15 ENCOUNTER — Other Ambulatory Visit (HOSPITAL_COMMUNITY): Payer: Self-pay

## 2023-09-01 ENCOUNTER — Telehealth: Payer: Self-pay | Admitting: Nurse Practitioner

## 2023-09-01 NOTE — Telephone Encounter (Signed)
Please call patient and let her know that she is due for colon cancer screening. If she would like referral for colonoscopy I can order this now. Please let me know.

## 2023-09-02 ENCOUNTER — Encounter: Payer: Self-pay | Admitting: Nurse Practitioner

## 2023-09-02 ENCOUNTER — Ambulatory Visit (INDEPENDENT_AMBULATORY_CARE_PROVIDER_SITE_OTHER): Payer: 59 | Admitting: Nurse Practitioner

## 2023-09-02 ENCOUNTER — Other Ambulatory Visit (HOSPITAL_COMMUNITY): Payer: Self-pay

## 2023-09-02 VITALS — BP 112/72 | HR 83 | Temp 97.6°F | Ht 66.0 in | Wt 214.2 lb

## 2023-09-02 DIAGNOSIS — H00015 Hordeolum externum left lower eyelid: Secondary | ICD-10-CM | POA: Insufficient documentation

## 2023-09-02 DIAGNOSIS — D259 Leiomyoma of uterus, unspecified: Secondary | ICD-10-CM

## 2023-09-02 DIAGNOSIS — E611 Iron deficiency: Secondary | ICD-10-CM

## 2023-09-02 DIAGNOSIS — Z1211 Encounter for screening for malignant neoplasm of colon: Secondary | ICD-10-CM | POA: Insufficient documentation

## 2023-09-02 MED ORDER — ERYTHROMYCIN 5 MG/GM OP OINT
1.0000 | TOPICAL_OINTMENT | Freq: Every day | OPHTHALMIC | 0 refills | Status: DC
  Filled 2023-09-02: qty 7, 30d supply, fill #0

## 2023-09-02 NOTE — Assessment & Plan Note (Signed)
Patient due for colon cancer screening Offered referral to GI for colonoscopy, patient declined We also discussed the role of Cologuard and as this is a noninvasive screening option.  She is declined this as well. Patient encouraged to let me know if she changes her mind, she reports understanding.

## 2023-09-02 NOTE — Assessment & Plan Note (Signed)
Chronic Referral to OB/GYN made today.

## 2023-09-02 NOTE — Progress Notes (Signed)
Established Patient Office Visit  Subjective   Patient ID: Erika Blair, female    DOB: [birth year]  Age: 47 y.o. MRN: [id number]  Chief Complaint  Patient presents with   Anemia    Anemia: Has seen hematology, currently on iron supplement. Taking once a day when on her menstrual cycle.  Hematology feels most likely source of anemia is uterine bleeding related to uterine fibroid.  Patient has not yet scheduled with OB/GYN to discuss management of this.  She would like referral of this.  Left eye swelling: Started occurring yesterday, lower eyelid is slightly swollen.  She reports she was cleaning and splashed water in her eye.  Denies any potential chemical exposure.  Not experiencing any pain unless she palpates the lower eyelid or squeezes eyelid shut.  Is not experiencing new visual changes.  Colon cancer screening: Patient is due for colon cancer screening.  She declines referral for colon cancer screening at this time.    ROSSee HPI    Objective:     BP 112/72   Pulse 83   Temp 97.6 F (36.4 C) (Temporal)   Ht 5\' 6"  (1.676 m)   Wt 214 lb 4 oz (97.2 kg)   SpO2 95%   BMI 34.58 kg/m  BP Readings from Last 3 Encounters:  09/02/23 112/72  06/25/23 108/77  06/03/23 98/76   Wt Readings from Last 3 Encounters:  09/02/23 214 lb 4 oz (97.2 kg)  06/03/23 212 lb 8 oz (96.4 kg)  04/15/23 204 lb (92.5 kg)      Physical Exam Vitals reviewed.  Constitutional:      General: She is not in acute distress.    Appearance: Normal appearance.  HENT:     Head: Normocephalic and atraumatic.  Eyes:     General: Lids are normal.        Right eye: No foreign body.        Left eye: Hordeolum present.No foreign body.     Conjunctiva/sclera: Conjunctivae normal.  Neck:     Vascular: No carotid bruit.  Cardiovascular:     Rate and Rhythm: Normal rate and regular rhythm.     Pulses: Normal pulses.     Heart sounds: Normal heart sounds.  Pulmonary:     Effort: Pulmonary  effort is normal.     Breath sounds: Normal breath sounds.  Skin:    General: Skin is warm and dry.  Neurological:     General: No focal deficit present.     Mental Status: She is alert and oriented to person, place, and time.  Psychiatric:        Mood and Affect: Mood normal.        Behavior: Behavior normal.        Judgment: Judgment normal.      No results found for any visits on 09/02/23.    The 10-year ASCVD risk score (Arnett DK, et al., 2019) is: 0.4%    Assessment & Plan:   Problem List Items Addressed This Visit       Genitourinary   Uterine leiomyoma - Primary    Chronic Referral to OB/GYN made today      Relevant Orders   Ambulatory referral to Obstetrics / Gynecology     Other   Iron deficiency    Chronic, improved with oral supplementation Encouraged continued use of iron supplementation as well as to follow-up with hematology as scheduled Patient to follow-up with OB/GYN, referral placed today as well to discuss  uterine fibroid which is most likely etiology of iron deficiency      Hordeolum externum of left lower eyelid    Acute Treat with erythromycin ointment application once per day at bedtime Patient told to call office if symptoms persist or worsen      Relevant Medications   erythromycin ophthalmic ointment   Colon cancer screening    Patient due for colon cancer screening Offered referral to GI for colonoscopy, patient declined We also discussed the role of Cologuard and as this is a noninvasive screening option.  She is declined this as well. Patient encouraged to let me know if she changes her mind, she reports understanding.       Return in about 9 months (around 06/02/2024) for CPE with Maralyn Sago.    Elenore Paddy, NP

## 2023-09-02 NOTE — Assessment & Plan Note (Signed)
Acute Treat with erythromycin ointment application once per day at bedtime Patient told to call office if symptoms persist or worsen

## 2023-09-02 NOTE — Assessment & Plan Note (Addendum)
Chronic, improved with oral supplementation Encouraged continued use of iron supplementation as well as to follow-up with hematology as scheduled Patient to follow-up with OB/GYN, referral placed today as well to discuss uterine fibroid which is most likely etiology of iron deficiency

## 2023-10-20 NOTE — Progress Notes (Deleted)
[facility] OFFICE PROGRESS NOTE  Elenore Paddy, NP [address] 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 female returns to the clinic today for a follow up visit. The patient established care in the clinic today for a follow up visit in August 2024 for IDA secondary to menorrhagia secondary to uterine fibroid. She is followed by *** for GYN. They are planning on ***. Dr. Arbutus Ped also recommended seeing Gi for routine colonoscopy. She has an appointment on ***.   For her anemia, she is on oral iron supplements with ***. She is *** with this. She denies fatigue. She denies chest pain, shortness of breath. She denies abnormal bleeding or bruising. Her energy is ***. She is here for evaluation and repeat blood work.   MEDICAL HISTORY: Past Medical History:  Diagnosis Date   Asthma    Chicken pox    Positive TB test    Uterine fibroid     ALLERGIES:  is allergic to chloroquine and penicillins.  MEDICATIONS:  Current Outpatient Medications  Medication Sig Dispense Refill   erythromycin ophthalmic ointment Place 1 Application into the left eye at bedtime. 7 g 0   FeFum-FePoly-FA-B Cmp-C-Biot (INTEGRA PLUS) CAPS Take 1 capsule by mouth daily. (Patient not taking: Reported on 09/02/2023) 30 capsule 3   Ferrous Sulfate (IRON PO) Take by mouth. PRN     No current facility-administered medications for this visit.    SURGICAL HISTORY: No past surgical history on file.  REVIEW OF SYSTEMS:   Review of Systems  Constitutional: Negative for appetite change, chills, fatigue, fever and unexpected weight change.  HENT:   Negative for mouth sores, nosebleeds, sore throat and trouble swallowing.   Eyes: Negative for eye problems and icterus.  Respiratory:  Negative for cough, hemoptysis, shortness of breath and wheezing.   Cardiovascular: Negative for chest pain and leg swelling.  Gastrointestinal: Negative for abdominal pain, constipation, diarrhea, nausea and vomiting.  Genitourinary: Negative for bladder incontinence, difficulty urinating, dysuria, frequency and hematuria.   Musculoskeletal: Negative for back pain, gait problem, neck pain and neck stiffness.  Skin: Negative for itching and rash.  Neurological: Negative for dizziness, extremity weakness, gait problem, headaches, light-headedness and seizures.  Hematological: Negative for adenopathy. Does not bruise/bleed easily.  Psychiatric/Behavioral: Negative for confusion, depression and sleep disturbance. The patient is not nervous/anxious.     PHYSICAL EXAMINATION:  There were no vitals taken for this visit.  ECOG PERFORMANCE STATUS: {CHL ONC ECOG [id number]  Physical Exam  Constitutional: Oriented to person, place, and time and well-developed, well-nourished, and in no distress. No distress.  HENT:  Head: Normocephalic and atraumatic.  Mouth/Throat: Oropharynx is clear and moist. No oropharyngeal exudate.  Eyes: Conjunctivae are normal. Right eye exhibits no discharge. Left eye exhibits no discharge. No scleral icterus.  Neck: Normal range of motion. Neck supple.  Cardiovascular: Normal rate, regular rhythm, normal heart sounds and intact distal pulses.   Pulmonary/Chest: Effort normal and breath sounds normal. No respiratory distress. No wheezes. No rales.  Abdominal: Soft. Bowel sounds are normal. Exhibits no distension and no mass. There is no tenderness.  Musculoskeletal: Normal range of motion. Exhibits no edema.  Lymphadenopathy:    No cervical adenopathy.  Neurological: Alert and oriented to person, place, and time. Exhibits normal muscle tone.  Gait normal. Coordination normal.  Skin: Skin is warm and dry. No rash noted. Not diaphoretic. No erythema. No pallor.   Psychiatric: Mood, memory and judgment normal.  Vitals reviewed.  LABORATORY DATA: Lab Results  Component Value Date   WBC 4.0 06/25/2023   HGB 11.8 (L) 06/25/2023   HCT 38.1 06/25/2023   MCV 77.8 (L) 06/25/2023   PLT 305 06/25/2023      Chemistry      Component Value Date/Time   NA 136 06/25/2023 1019   NA 138 05/20/2021 1520   K 4.0 06/25/2023 1019   CL 106 06/25/2023 1019   CO2 25 06/25/2023 1019   BUN 16 06/25/2023 1019   BUN 8 05/20/2021 1520   CREATININE 0.80 06/25/2023 1019      Component Value Date/Time   CALCIUM 9.0 06/25/2023 1019   ALKPHOS 40 06/25/2023 1019   AST 17 06/25/2023 1019   ALT 11 06/25/2023 1019   BILITOT 0.3 06/25/2023 1019       RADIOGRAPHIC STUDIES:  No results found.   ASSESSMENT/PLAN:  This is a very pleasant 47 year old African female presented for evaluation of iron deficiency anemia secondary to chronic blood loss from menorrhagia secondary to uterine fibroid.   She is on oral iron supplements with ***.   Labs were reviewed. Her CBC today shows ***. Her iron studies show ***.   I will call her with her iron studies if low and if she requires IV iron infusion.   GYN  Gi  The patient was advised to call immediately if she has any concerning symptoms in the interval. The patient voices understanding of current disease status and treatment options and is in agreement with the current care plan. All questions were answered. The patient knows to call the clinic with any problems, questions or concerns. We can certainly see the patient much sooner if necessary       No orders of the defined types were placed in this encounter.    I spent {CHL ONC TIME VISIT - ZOXWR:[id number]} counseling the patient face to face. The total time spent in the appointment was {CHL ONC TIME VISIT - BJYNW:[id number]}.  Pasqualino Witherspoon L Deondrick Searls, PA-C 10/20/23

## 2023-10-24 ENCOUNTER — Inpatient Hospital Stay: Payer: 59 | Attending: Physician Assistant

## 2023-10-24 ENCOUNTER — Inpatient Hospital Stay: Payer: 59 | Admitting: Physician Assistant

## 2023-10-27 ENCOUNTER — Telehealth: Payer: Self-pay | Admitting: Physician Assistant

## 2023-10-27 NOTE — Telephone Encounter (Signed)
 Erika Blair

## 2023-11-01 NOTE — Progress Notes (Signed)
 [facility] OFFICE PROGRESS NOTE  Erika Lauraine BRAVO, NP [address] [place] [address]  DIAGNOSIS: iron  deficiency anemia secondary to chronic blood loss from menorrhagia secondary to uterine fibroid   PRIOR THERAPY: None  CURRENT THERAPY: OTC iron  supplement when she has a menstrual cycle  INTERVAL HISTORY: Erika Blair 48 y.o. female returns to the clinic today for a follow up visit. The patient established care in the clinic today for a follow up visit in August 2024 for IDA secondary to menorrhagia secondary to uterine fibroid. She is no longer having monthly menstrual cycles.  It has been a few months since her last menstrual cycle.  Her PCP did refer her to GYN in November 2004 but she has not heard from them for follow-up visit.  The patient also has not made up her mind about seeing GI for colonoscopy.  Dr. Sherrod recommended that her PCP refer her to GI for colonoscopy as well.  Otherwise the patient is feeling well today.  She denies any significant fatigue.  Denies any shortness of breath or lightheadedness.  She denies any abnormal bleeding or bruising.  She eats green leafy vegetables such as spinach.  She has not been taking the Integra plus .  Instead, she is taking over-the-counter iron  supplements.  She is only taking these when she has her menstrual cycle.  She is here for evaluation and repeat blood work.    MEDICAL HISTORY: Past Medical History:  Diagnosis Date   Asthma    Chicken pox    Positive TB test    Uterine fibroid     ALLERGIES:  is allergic to chloroquine and penicillins.  MEDICATIONS:  Current Outpatient Medications  Medication Sig Dispense Refill   Ferrous Sulfate (IRON  PO) Take by mouth. PRN     erythromycin  ophthalmic ointment Place 1 Application into the left eye at bedtime. (Patient not taking: Reported on 11/04/2023) 7 g 0   FeFum-FePoly-FA-B Cmp-C-Biot (INTEGRA PLUS ) CAPS Take 1 capsule by mouth daily. (Patient not  taking: Reported on 09/02/2023) 30 capsule 3   No current facility-administered medications for this visit.    SURGICAL HISTORY: No past surgical history on file.  REVIEW OF SYSTEMS:   Review of Systems  Constitutional: Negative for appetite change, chills, fatigue, fever and unexpected weight change.  HENT:   Negative for mouth sores, nosebleeds, sore throat and trouble swallowing.   Eyes: Negative for eye problems and icterus.  Respiratory: Negative for cough, hemoptysis, shortness of breath and wheezing.   Cardiovascular: Negative for chest pain and leg swelling.  Gastrointestinal: Negative for abdominal pain, constipation, diarrhea, nausea and vomiting.  Genitourinary: Negative for bladder incontinence, difficulty urinating, dysuria, frequency and hematuria.   Musculoskeletal: Negative for back pain, gait problem, neck pain and neck stiffness.  Skin: Negative for itching and rash.  Neurological: Negative for dizziness, extremity weakness, gait problem, headaches, light-headedness and seizures.  Hematological: Negative for adenopathy. Does not bruise/bleed easily.  Psychiatric/Behavioral: Negative for confusion, depression and sleep disturbance. The patient is not nervous/anxious.     PHYSICAL EXAMINATION:  Blood pressure 113/73, pulse 78, temperature 97.7 F (36.5 C), temperature source Temporal, resp. rate 14, weight 212 lb 1.6 oz (96.2 kg), SpO2 98%.  ECOG PERFORMANCE STATUS: 1  Physical Exam  Constitutional: Oriented to person, place, and time and well-developed, well-nourished, and in no distress.[name]  HENT:  Head: Normocephalic and atraumatic.  Mouth/Throat: Oropharynx is clear and moist. No oropharyngeal exudate.  Eyes: Conjunctivae are normal. Right  eye exhibits no discharge. Left eye exhibits no discharge. No scleral icterus.  Neck: Normal range of motion. Neck supple.  Cardiovascular: Normal rate, regular rhythm, normal heart sounds and intact distal pulses.    Pulmonary/Chest: Effort normal and breath sounds normal. No respiratory distress. No wheezes. No rales.  Abdominal: Soft. Bowel sounds are normal. Exhibits no distension and no mass. There is no tenderness.  Musculoskeletal: Normal range of motion. Exhibits no edema.  Lymphadenopathy:    No cervical adenopathy.  Neurological: Alert and oriented to person, place, and time. Exhibits normal muscle tone. Gait normal. Coordination normal.  Skin: Skin is warm and dry. No rash noted. Not diaphoretic. No erythema. No pallor.  Psychiatric: Mood, memory and judgment normal.  Vitals reviewed.  LABORATORY DATA: Lab Results  Component Value Date   WBC 4.3 11/04/2023   HGB 12.8 11/04/2023   HCT 39.6 11/04/2023   MCV 81.5 11/04/2023   PLT 232 11/04/2023      Chemistry      Component Value Date/Time   NA 136 06/25/2023 1019   NA 138 05/20/2021 1520   K 4.0 06/25/2023 1019   CL 106 06/25/2023 1019   CO2 25 06/25/2023 1019   BUN 16 06/25/2023 1019   BUN 8 05/20/2021 1520   CREATININE 0.80 06/25/2023 1019      Component Value Date/Time   CALCIUM 9.0 06/25/2023 1019   ALKPHOS 40 06/25/2023 1019   AST 17 06/25/2023 1019   ALT 11 06/25/2023 1019   BILITOT 0.3 06/25/2023 1019       RADIOGRAPHIC STUDIES:  No results found.   ASSESSMENT/PLAN:  This is a very pleasant 48 year old African female presented for evaluation of iron  deficiency anemia secondary to chronic blood loss from menorrhagia secondary to uterine fibroid.   She is on oral iron  supplements with OTC iron .  She takes this only when she has menstrual cycles..   Labs were reviewed. Her CBC today shows everything is WNL. Her iron  studies are pending.   I will call her with her iron  studies if low and if she requires IV iron  infusion, however, I think that will be unlikely. She tolerates the oral iron  well. I likely would recommend that she increase the frequency in which she takes this.   We will see her back for labs and  follow-up visit in 5 to 6 months.  She will reach out to her PCPs office to follow-up on the GYN referral.  The patient was advised to call immediately if she has any concerning symptoms in the interval. The patient voices understanding of current disease status and treatment options and is in agreement with the current care plan. All questions were answered. The patient knows to call the clinic with any problems, questions or concerns. We can certainly see the patient much sooner if necessary    Orders Placed This Encounter  Procedures   CBC with Differential (Cancer Center Only)    Standing Status:   Future    Expected Date:   05/03/2024    Expiration Date:   11/03/2024   Ferritin    Standing Status:   Future    Expected Date:   05/03/2024    Expiration Date:   11/03/2024   Iron  and Iron  Binding Capacity ([facility] only)    Standing Status:   Future    Expected Date:   05/03/2024    Expiration Date:   11/03/2024      The total time spent in the appointment  was .20-29 minutes Nioka Thorington L Suleika Donavan, PA-C 11/04/23

## 2023-11-03 ENCOUNTER — Telehealth: Payer: Self-pay | Admitting: Medical Oncology

## 2023-11-03 NOTE — Telephone Encounter (Signed)
 Pt confirmed appt for 01/10.

## 2023-11-04 ENCOUNTER — Inpatient Hospital Stay: Payer: 59 | Attending: Physician Assistant | Admitting: Physician Assistant

## 2023-11-04 ENCOUNTER — Inpatient Hospital Stay: Payer: 59

## 2023-11-04 VITALS — BP 113/73 | HR 78 | Temp 97.7°F | Resp 14 | Wt 212.1 lb

## 2023-11-04 DIAGNOSIS — D500 Iron deficiency anemia secondary to blood loss (chronic): Secondary | ICD-10-CM | POA: Diagnosis not present

## 2023-11-04 DIAGNOSIS — D259 Leiomyoma of uterus, unspecified: Secondary | ICD-10-CM | POA: Diagnosis not present

## 2023-11-04 DIAGNOSIS — N920 Excessive and frequent menstruation with regular cycle: Secondary | ICD-10-CM | POA: Diagnosis not present

## 2023-11-04 DIAGNOSIS — E611 Iron deficiency: Secondary | ICD-10-CM | POA: Diagnosis not present

## 2023-11-04 LAB — CBC WITH DIFFERENTIAL (CANCER CENTER ONLY)
Abs Immature Granulocytes: 0 10*3/uL (ref 0.00–0.07)
Basophils Absolute: 0 10*3/uL (ref 0.0–0.1)
Basophils Relative: 1 %
Eosinophils Absolute: 0.3 10*3/uL (ref 0.0–0.5)
Eosinophils Relative: 7 %
HCT: 39.6 % (ref 36.0–46.0)
Hemoglobin: 12.8 g/dL (ref 12.0–15.0)
Immature Granulocytes: 0 %
Lymphocytes Relative: 35 %
Lymphs Abs: 1.5 10*3/uL (ref 0.7–4.0)
MCH: 26.3 pg (ref 26.0–34.0)
MCHC: 32.3 g/dL (ref 30.0–36.0)
MCV: 81.5 fL (ref 80.0–100.0)
Monocytes Absolute: 0.5 10*3/uL (ref 0.1–1.0)
Monocytes Relative: 11 %
Neutro Abs: 2.1 10*3/uL (ref 1.7–7.7)
Neutrophils Relative %: 46 %
Platelet Count: 232 10*3/uL (ref 150–400)
RBC: 4.86 MIL/uL (ref 3.87–5.11)
RDW: 13.5 % (ref 11.5–15.5)
WBC Count: 4.3 10*3/uL (ref 4.0–10.5)
nRBC: 0 % (ref 0.0–0.2)

## 2023-11-04 LAB — FERRITIN: Ferritin: 14 ng/mL (ref 11–307)

## 2023-11-04 LAB — IRON AND IRON BINDING CAPACITY (CC-WL,HP ONLY)
Iron: 79 ug/dL (ref 28–170)
Saturation Ratios: 19 % (ref 10.4–31.8)
TIBC: 419 ug/dL (ref 250–450)
UIBC: 340 ug/dL (ref 148–442)

## 2023-11-07 ENCOUNTER — Encounter: Payer: Self-pay | Admitting: Physician Assistant

## 2023-12-16 ENCOUNTER — Telehealth: Payer: Self-pay | Admitting: Nurse Practitioner

## 2023-12-16 DIAGNOSIS — N920 Excessive and frequent menstruation with regular cycle: Secondary | ICD-10-CM

## 2023-12-16 NOTE — Telephone Encounter (Signed)
 Copied from CRM [id number]. Topic: Referral - Request for Referral >> Dec 16, 2023 12:00 PM Fredrich Romans wrote: Did the patient discuss referral with their provider in the last year? Yes (If No - schedule appointment) (If Yes - send message)  Appointment offered? No(discussed with provider at last visit)  Type of order/referral and detailed reason for visit: OBGYN- bleeding for longer than two weeks  Preference of office, provider, location: As soon as possible  If referral order, have you been seen by this specialty before? No (If Yes, this issue or another issue? When? Where?  Can we respond through MyChart? Yes

## 2023-12-21 ENCOUNTER — Telehealth: Payer: Self-pay

## 2023-12-21 DIAGNOSIS — N920 Excessive and frequent menstruation with regular cycle: Secondary | ICD-10-CM

## 2023-12-21 NOTE — Telephone Encounter (Signed)
 Left detail message for pt to call the GYN office and request to see to have a GYN doctor  instead of the midwife

## 2023-12-21 NOTE — Telephone Encounter (Signed)
 Copied from CRM [id number]. Topic: Referral - Question >> Dec 21, 2023  9:17 AM Orinda Kenner C wrote: Reason for CRM: Patient states the referral is not correct, she needs to see an OB/GYNE doctor not midwife. Please advise and call back [phone]

## 2023-12-22 NOTE — Telephone Encounter (Signed)
 Copied from CRM [id number]. Topic: Referral - Question >> Dec 22, 2023  8:37 AM Erika Blair wrote: Reason for CRM: Patient stated the OB-GYN that she was referred to is no longer taking patients. So she would like a new referral to a female OB-GYN.

## 2023-12-22 NOTE — Addendum Note (Signed)
 Addended by: Cathleen Fears, Morrison Masser P on: 12/22/2023 12:19 PM   Modules accepted: Orders

## 2023-12-29 NOTE — Telephone Encounter (Signed)
 Issue is taken care of in different encounter

## 2024-01-18 ENCOUNTER — Ambulatory Visit: Payer: Self-pay

## 2024-01-18 NOTE — Telephone Encounter (Signed)
 Copied from CRM [id number]. Topic: Clinical - Red Word Triage >> Jan 18, 2024 11:56 AM Arley Phenix D wrote: Red Word that prompted transfer to Nurse Triage: Heavy bleeding, tiredness . Patient stated that she is experiencing heavy bleeding and its also making her tired.  Chief Complaint: vaginal bleeding Symptoms: heavy vaginal bleeding, tired, weakness Frequency: started yesterday Pertinent Negatives: Patient denies SOB Disposition: [] ED /[] Urgent Care (no appt availability in office) / [x] Appointment(In office/virtual)/ []  [facility]/ [] Home Care/ [] Refused Recommended Disposition /[] [facility]/ []  Follow-up with PCP Additional Notes:pt  has IUD therefore does not have a cycle every month. Using 4-6 pads per 8 hours: using extra large pads which is same as after birth pads. Pt on iron pill.  Reason for Disposition  Periods with > 6 soaked pads or tampons per day    Pt having heavy vaginal bleeding during cycle: history of irregular cycles therefore does not have cycle every month: feeling tired and weak  Answer Assessment - Initial Assessment Questions 1. AMOUNT: "Describe the bleeding that you are having."    - SPOTTING: spotting, or pinkish / brownish mucous discharge; does not fill panty liner or pad    - MILD:  less than 1 pad / hour; less than patient's usual menstrual bleeding   - MODERATE: 1-2 pads / hour; 1 menstrual cup every 6 hours; small-medium blood clots (e.g., pea, grape, small coin)   - SEVERE: soaking 2 or more pads/hour for 2 or more hours; 1 menstrual cup every 2 hours; bleeding not contained by pads or continuous red blood from vagina; large blood clots (e.g., golf ball, large coin)      severe 2. ONSET: "When did the bleeding begin?" "Is it continuing now?"     yesterday 3. MENSTRUAL PERIOD: "When was the last normal menstrual period?" "How is this different than your period?"     Cycle is much heavier than normal 4. REGULARITY: "How regular are your  periods?"     Very irregular - do not have cycle every month 5. ABDOMEN PAIN: "Do you have any pain?" "How bad is the pain?"  (e.g., Scale 1-10; mild, moderate, or severe)   - MILD (1-3): doesn't interfere with normal activities, abdomen soft and not tender to touch    - MODERATE (4-7): interferes with normal activities or awakens from sleep, abdomen tender to touch    - SEVERE (8-10): excruciating pain, doubled over, unable to do any normal activities      No pain 6. PREGNANCY: "Is there any chance you are pregnant?" "When was your last menstrual period?"     No has IUD in place 7. BREASTFEEDING: "Are you breastfeeding?"     no 8. HORMONE MEDICINES: "Are you taking any hormone medicines, prescription or over-the-counter?" (e.g., birth control pills, estrogen)     no 9. BLOOD THINNER MEDICINES: "Do you take any blood thinners?" (e.g., Coumadin / warfarin, Pradaxa / dabigatran, aspirin)     No 10. CAUSE: "What do you think is causing the bleeding?" (e.g., recent gyn surgery, recent gyn procedure; known bleeding disorder, cervical cancer, polycystic ovarian disease, fibroids)         N/a 11. HEMODYNAMIC STATUS: "Are you weak or feeling lightheaded?" If Yes, ask: "Can you stand and walk normally?"        Weak, tired 12. OTHER SYMPTOMS: "What other symptoms are you having with the bleeding?" (e.g., passed tissue, vaginal discharge, fever, menstrual-type cramps)       Heavy bleeding  Protocols used: Vaginal Bleeding - Abnormal-A-AH

## 2024-01-19 ENCOUNTER — Ambulatory Visit: Admitting: Internal Medicine

## 2024-02-27 ENCOUNTER — Encounter: Payer: Self-pay | Admitting: Obstetrics and Gynecology

## 2024-02-27 ENCOUNTER — Other Ambulatory Visit (HOSPITAL_COMMUNITY)
Admission: RE | Admit: 2024-02-27 | Discharge: 2024-02-27 | Disposition: A | Discharge status: Home / Self Care | Source: Ambulatory Visit | Attending: Obstetrics and Gynecology | Admitting: Obstetrics and Gynecology

## 2024-02-27 ENCOUNTER — Ambulatory Visit: Admitting: Obstetrics and Gynecology

## 2024-02-27 VITALS — BP 120/77 | HR 84 | Ht 65.0 in | Wt 205.0 lb

## 2024-02-27 DIAGNOSIS — Z30430 Encounter for insertion of intrauterine contraceptive device: Secondary | ICD-10-CM | POA: Diagnosis not present

## 2024-02-27 DIAGNOSIS — Z01419 Encounter for gynecological examination (general) (routine) without abnormal findings: Secondary | ICD-10-CM | POA: Insufficient documentation

## 2024-02-27 DIAGNOSIS — Z3202 Encounter for pregnancy test, result negative: Secondary | ICD-10-CM | POA: Diagnosis not present

## 2024-02-27 LAB — POCT URINE PREGNANCY: Preg Test, Ur: NEGATIVE

## 2024-02-27 MED ORDER — LEVONORGESTREL 20 MCG/DAY IU IUD
1.0000 | INTRAUTERINE_SYSTEM | Freq: Once | INTRAUTERINE | Status: AC
  Administered 2024-02-27: 1 via INTRAUTERINE

## 2024-02-27 NOTE — Progress Notes (Signed)
 Pt is new to office, having heavy bleeding. Pt has IUD in place- unsure type or how long she's had it. Pt also having cramping with cycle.  Pt also complains of poor vision.

## 2024-02-27 NOTE — Progress Notes (Signed)
 Subjective:     Erika Blair is a 48 y.o. female P2 with LMP 02/06/24 and BMI 34 who is here for a comprehensive physical exam. The patient reports heavy monthly menses lasting 5-7 days and associated with dysmenorrhea. This bleeding pattern has been present for the past 3-4 months. Patient is sexually active using a Mirena  IUD. She has known submucosal fibroid discovered on a September 2024 ultrasound. Patient is without any other complaints. She denies urinary incontinence or issues with bowel movements  Past Medical History:  Diagnosis Date   Asthma    Chicken pox    Positive TB test    Uterine fibroid    History reviewed. No pertinent surgical history. Family History  Problem Relation Age of Onset   Diabetes Mother    Hypertension Sister    Diabetes Sister    Diabetes Brother     Social History   Socioeconomic History   Marital status: Married    Spouse name: Not on file   Number of children: Not on file   Years of education: Not on file   Highest education level: Not on file  Occupational History   Occupation: Public house manager:   Tobacco Use   Smoking status: Never   Smokeless tobacco: Never  Substance and Sexual Activity   Alcohol use: No   Drug use: No   Sexual activity: Yes    Birth control/protection: I.U.D.  Other Topics Concern   Not on file  Social History Narrative   Not on file   Social Drivers of Health   Financial Resource Strain: Not on file  Food Insecurity: Not on file  Transportation Needs: Not on file  Physical Activity: Not on file  Stress: Not on file  Social Connections: Not on file  Intimate Partner Violence: Not on file   Health Maintenance  Topic Date Due   Colonoscopy  Never done   COVID-19 Vaccine (1 - 2024-25 season) Never done   INFLUENZA VACCINE  05/25/2024   Cervical Cancer Screening (HPV/Pap Cotest)  05/20/2026   DTaP/Tdap/Td (2 - Td or Tdap) 05/21/2031   Hepatitis C Screening  Completed    HIV Screening  Completed   HPV VACCINES  Aged Out   Meningococcal B Vaccine  Aged Out       Review of Systems Pertinent items noted in HPI and remainder of comprehensive ROS otherwise negative.   Objective:  Blood pressure 120/77, pulse 84, height 5\' 5"  (1.651 m), weight 205 lb (93 kg), last menstrual period 02/06/2024.   GENERAL: Well-developed, well-nourished female in no acute distress.  HEENT: Normocephalic, atraumatic. Sclerae anicteric.  NECK: Supple. Normal thyroid .  LUNGS: Clear to auscultation bilaterally.  HEART: Regular rate and rhythm. BREASTS: Symmetric in size. No palpable masses or lymphadenopathy, skin changes, or nipple drainage. ABDOMEN: Soft, nontender, nondistended. No organomegaly. PELVIC: Normal external female genitalia. Vagina is pink and rugated.  Normal discharge. Normal appearing cervix. Uterus is normal in size. No adnexal mass or tenderness. Chaperone present during the pelvic exam EXTREMITIES: No cyanosis, clubbing, or edema, 2+ distal pulses.    06/2023 ultrasound EXAM: ULTRASOUND OF PELVIS   TECHNIQUE: Transabdominal and transvaginalultrasound examination of the pelvis was performed including evaluation of the uterus, ovaries, adnexal regions, and pelvic cul-de-sac.   COMPARISON:  None Available.   FINDINGS: Uterusanteverted, 10 x 8 x 8 cm. The endometrium is 6 mm displaced by a submucosal fibroid. The uterine cavity is empty.   Posterior intramural/submucosal fibroid measures  6.5 x 5.3 x 5.2 cm.   Right ovary   3.5 x 2.9 x 2.6 cm with a simple 2.3 cm follicle.   Left ovary   Unremarkable, 2.1 x 1.6 x 1.2 cm.   Images of the adnexae demonstrated no masses or fluid collections.   IMPRESSION: 1. Posterior fundal intramural fibroid with a submucosal component displacing the endometrium. 2. No adnexal pathology.     Electronically Signed   By: Sydell Eva M.D.   On: 07/03/2023 18:25 Assessment:    Healthy female exam.       Plan:    Pap smear collected Screening mammogram ordered Patient referred for screening colonoscopy Reviewed ultrasound findings and discussed surgical management with hysterectomy. Patient is interested in uterus preserving procedure. Discussed Sonata and medical management with lysteda. Patient opted for lysteda. Patient was also informed that Mirena  IUD was not visualized in her ultrasound. Patient requested Mirena  IUD insertion for both contraception and management of her AUB IUD Procedure Note Patient identified, informed consent performed, signed copy in chart, time out was performed.  Urine pregnancy test negative.  Speculum placed in the vagina.  Cervix visualized.  Cleaned with Betadine x 2.  Grasped anteriorly with a single tooth tenaculum.  Uterus sounded to 10 cm.  Mirena  IUD placed per manufacturer's recommendations.  Strings trimmed to 3 cm. Tenaculum was removed, good hemostasis noted.  Patient tolerated procedure well.   Patient given post procedure instructions and Mirena  care card with expiration date.  Patient is asked to check IUD strings periodically and follow up in 4-6 weeks for IUD check.   See After Visit Summary for Counseling Recommendations

## 2024-02-29 LAB — URINE CULTURE

## 2024-03-01 ENCOUNTER — Encounter: Payer: Self-pay | Admitting: Obstetrics & Gynecology

## 2024-03-01 ENCOUNTER — Other Ambulatory Visit (HOSPITAL_COMMUNITY): Payer: Self-pay

## 2024-03-01 ENCOUNTER — Other Ambulatory Visit: Payer: Self-pay | Admitting: Obstetrics & Gynecology

## 2024-03-01 DIAGNOSIS — N3000 Acute cystitis without hematuria: Secondary | ICD-10-CM

## 2024-03-01 MED ORDER — CIPROFLOXACIN HCL 500 MG PO TABS
500.0000 mg | ORAL_TABLET | Freq: Two times a day (BID) | ORAL | 0 refills | Status: AC
  Filled 2024-03-01 – 2024-03-05 (×2): qty 6, 3d supply, fill #0

## 2024-03-02 ENCOUNTER — Encounter: Payer: Self-pay | Admitting: Obstetrics & Gynecology

## 2024-03-02 LAB — CYTOLOGY - PAP
Comment: NEGATIVE
Diagnosis: NEGATIVE
High risk HPV: NEGATIVE

## 2024-03-05 ENCOUNTER — Other Ambulatory Visit (HOSPITAL_BASED_OUTPATIENT_CLINIC_OR_DEPARTMENT_OTHER): Payer: Self-pay

## 2024-03-05 ENCOUNTER — Other Ambulatory Visit (HOSPITAL_COMMUNITY): Payer: Self-pay

## 2024-03-08 ENCOUNTER — Telehealth: Payer: Self-pay

## 2024-04-02 ENCOUNTER — Telehealth: Payer: Self-pay | Admitting: Physician Assistant

## 2024-04-02 ENCOUNTER — Ambulatory Visit: Admitting: Obstetrics and Gynecology

## 2024-04-02 NOTE — Progress Notes (Deleted)
 [facility] OFFICE PROGRESS NOTE  Erika Hiss, NP [address] [place] [address]  DIAGNOSIS: iron  deficiency anemia secondary to chronic blood loss from menorrhagia secondary to uterine fibroid   PRIOR THERAPY: None  CURRENT THERAPY: OTC iron  supplement when she has a menstrual cycle   INTERVAL HISTORY: Erika Blair 48 y.o. female returns to the clinic today for a follow up visit. The patient established care in the clinic today for a follow up visit in August 2024 for IDA secondary to menorrhagia secondary to uterine fibroid. She is no longer having monthly menstrual cycles.  It has been a ***few months since her last menstrual cycle.  She saw GYN recently who arranged pelvis US  which showed fibroid. The patient also has not made up her mind about seeing GI for colonoscopy***.  Dr. Marguerita Shih recommended that her PCP refer her to GI for colonoscopy as well.   Otherwise the patient is feeling well today.  She denies any significant fatigue. Denies any shortness of breath or lightheadedness.  She denies any abnormal bleeding or bruising.  She eats green leafy vegetables such as spinach.  She has not been taking the Integra plus .  Instead, she is taking over-the-counter iron  supplements.  She is only taking these when she has her menstrual cycle.  She is here for evaluation and repeat blood work.     MEDICAL HISTORY: Past Medical History:  Diagnosis Date   Asthma    Chicken pox    Positive TB test    Uterine fibroid     ALLERGIES:  is allergic to chloroquine and penicillins.  MEDICATIONS:  Current Outpatient Medications  Medication Sig Dispense Refill   ciprofloxacin  (CIPRO ) 500 MG tablet Take 1 tablet (500 mg total) by mouth 2 (two) times daily. 6 tablet 0   Ferrous Sulfate (IRON  PO) Take by mouth. PRN     No current facility-administered medications for this visit.    SURGICAL HISTORY: No past surgical history on file.  REVIEW OF SYSTEMS:    Review of Systems  Constitutional: Negative for appetite change, chills, fatigue, fever and unexpected weight change.  HENT:   Negative for mouth sores, nosebleeds, sore throat and trouble swallowing.   Eyes: Negative for eye problems and icterus.  Respiratory: Negative for cough, hemoptysis, shortness of breath and wheezing.   Cardiovascular: Negative for chest pain and leg swelling.  Gastrointestinal: Negative for abdominal pain, constipation, diarrhea, nausea and vomiting.  Genitourinary: Negative for bladder incontinence, difficulty urinating, dysuria, frequency and hematuria.   Musculoskeletal: Negative for back pain, gait problem, neck pain and neck stiffness.  Skin: Negative for itching and rash.  Neurological: Negative for dizziness, extremity weakness, gait problem, headaches, light-headedness and seizures.  Hematological: Negative for adenopathy. Does not bruise/bleed easily.  Psychiatric/Behavioral: Negative for confusion, depression and sleep disturbance. The patient is not nervous/anxious.     PHYSICAL EXAMINATION:  There were no vitals taken for this visit.  ECOG PERFORMANCE STATUS: {CHL ONC ECOG [id number]  Physical Exam  Constitutional: Oriented to person, place, and time and well-developed, well-nourished, and in no distress. No distress.  HENT:  Head: Normocephalic and atraumatic.  Mouth/Throat: Oropharynx is clear and moist. No oropharyngeal exudate.  Eyes: Conjunctivae are normal. Right eye exhibits no discharge. Left eye exhibits no discharge. No scleral icterus.  Neck: Normal range of motion. Neck supple.  Cardiovascular: Normal rate, regular rhythm, normal heart sounds and intact distal pulses.   Pulmonary/Chest: Effort normal and breath sounds normal. No  respiratory distress. No wheezes. No rales.  Abdominal: Soft. Bowel sounds are normal. Exhibits no distension and no mass. There is no tenderness.  Musculoskeletal: Normal range of motion. Exhibits no  edema.  Lymphadenopathy:    No cervical adenopathy.  Neurological: Alert and oriented to person, place, and time. Exhibits normal muscle tone. Gait normal. Coordination normal.  Skin: Skin is warm and dry. No rash noted. Not diaphoretic. No erythema. No pallor.  Psychiatric: Mood, memory and judgment normal.  Vitals reviewed.  LABORATORY DATA: Lab Results  Component Value Date   WBC 4.3 11/04/2023   HGB 12.8 11/04/2023   HCT 39.6 11/04/2023   MCV 81.5 11/04/2023   PLT 232 11/04/2023      Chemistry      Component Value Date/Time   NA 136 06/25/2023 1019   NA 138 05/20/2021 1520   K 4.0 06/25/2023 1019   CL 106 06/25/2023 1019   CO2 25 06/25/2023 1019   BUN 16 06/25/2023 1019   BUN 8 05/20/2021 1520   CREATININE 0.80 06/25/2023 1019      Component Value Date/Time   CALCIUM 9.0 06/25/2023 1019   ALKPHOS 40 06/25/2023 1019   AST 17 06/25/2023 1019   ALT 11 06/25/2023 1019   BILITOT 0.3 06/25/2023 1019       RADIOGRAPHIC STUDIES:  No results found.   ASSESSMENT/PLAN:  This is a very pleasant 48 year old African female presented for evaluation of iron  deficiency anemia secondary to chronic blood loss from menorrhagia secondary to uterine fibroid.    She is on oral iron  supplements with OTC iron .  She takes this only when she has menstrual cycles..    Labs were reviewed. Her CBC today shows everything is WNL. Her iron  studies are pending.    I will call her with her iron  studies if low and if she requires IV iron  infusion, however, I think that will be unlikely. She tolerates the oral iron  well. I likely would recommend that she increase the frequency in which she takes this. ***   We will see her back for labs and follow-up visit in 5 to 6 months.     The patient was advised to call immediately if she has any concerning symptoms in the interval. The patient voices understanding of current disease status and treatment options and is in agreement with the current  care plan. All questions were answered. The patient knows to call the clinic with any problems, questions or concerns. We can certainly see the patient much sooner if necessary     No orders of the defined types were placed in this encounter.    I spent {CHL ONC TIME VISIT - NWGNF:[id number]} counseling the patient face to face. The total time spent in the appointment was {CHL ONC TIME VISIT - IONGE:[id number]}.  Ayrianna Mcginniss L Maizee Reinhold, PA-C 04/02/24

## 2024-04-02 NOTE — Progress Notes (Deleted)
 [facility] OFFICE PROGRESS NOTE  Zorita Hiss, NP [address] 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 female returns for *** regular *** visit for followup of ***   MEDICAL HISTORY: Past Medical History:  Diagnosis Date   Asthma    Chicken pox    Positive TB test    Uterine fibroid     ALLERGIES:  is allergic to chloroquine and penicillins.  MEDICATIONS:  Current Outpatient Medications  Medication Sig Dispense Refill   ciprofloxacin  (CIPRO ) 500 MG tablet Take 1 tablet (500 mg total) by mouth 2 (two) times daily. 6 tablet 0   Ferrous Sulfate (IRON  PO) Take by mouth. PRN     No current facility-administered medications for this visit.    SURGICAL HISTORY: No past surgical history on file.  REVIEW OF SYSTEMS:   Review of Systems  Constitutional: Negative for appetite change, chills, fatigue, fever and unexpected weight change.  HENT:   Negative for mouth sores, nosebleeds, sore throat and trouble swallowing.   Eyes: Negative for eye problems and icterus.  Respiratory: Negative for cough, hemoptysis, shortness of breath and wheezing.   Cardiovascular: Negative for chest pain and leg swelling.  Gastrointestinal: Negative for abdominal pain, constipation, diarrhea, nausea and vomiting.  Genitourinary: Negative for bladder incontinence, difficulty urinating, dysuria, frequency and hematuria.   Musculoskeletal: Negative for back pain, gait problem, neck pain and neck stiffness.  Skin: Negative for itching and rash.  Neurological: Negative for dizziness, extremity weakness, gait problem, headaches, light-headedness and seizures.  Hematological: Negative for adenopathy. Does not bruise/bleed easily.  Psychiatric/Behavioral: Negative for confusion, depression and sleep  disturbance. The patient is not nervous/anxious.     PHYSICAL EXAMINATION:  There were no vitals taken for this visit.  ECOG PERFORMANCE STATUS: {CHL ONC ECOG [id number]  Physical Exam  Constitutional: Oriented to person, place, and time and well-developed, well-nourished, and in no distress. No distress.  HENT:  Head: Normocephalic and atraumatic.  Mouth/Throat: Oropharynx is clear and moist. No oropharyngeal exudate.  Eyes: Conjunctivae are normal. Right eye exhibits no discharge. Left eye exhibits no discharge. No scleral icterus.  Neck: Normal range of motion. Neck supple.  Cardiovascular: Normal rate, regular rhythm, normal heart sounds and intact distal pulses.   Pulmonary/Chest: Effort normal and breath sounds normal. No respiratory distress. No wheezes. No rales.  Abdominal: Soft. Bowel sounds are normal. Exhibits no distension and no mass. There is no tenderness.  Musculoskeletal: Normal range of motion. Exhibits no edema.  Lymphadenopathy:    No cervical adenopathy.  Neurological: Alert and oriented to person, place, and time. Exhibits normal muscle tone. Gait normal. Coordination normal.  Skin: Skin is warm and dry. No rash noted. Not diaphoretic. No erythema. No pallor.  Psychiatric: Mood, memory and judgment normal.  Vitals reviewed.  LABORATORY DATA: Lab Results  Component Value Date   WBC 4.3 11/04/2023   HGB 12.8 11/04/2023   HCT 39.6 11/04/2023   MCV 81.5 11/04/2023   PLT 232 11/04/2023      Chemistry      Component Value Date/Time   NA 136 06/25/2023 1019   NA 138 05/20/2021 1520   K 4.0 06/25/2023 1019   CL 106 06/25/2023 1019   CO2 25 06/25/2023 1019   BUN 16 06/25/2023 1019   BUN 8 05/20/2021 1520   CREATININE  0.80 06/25/2023 1019      Component Value Date/Time   CALCIUM 9.0 06/25/2023 1019   ALKPHOS 40 06/25/2023 1019   AST 17 06/25/2023 1019   ALT 11 06/25/2023 1019   BILITOT 0.3 06/25/2023 1019       RADIOGRAPHIC STUDIES:  No  results found.   ASSESSMENT/PLAN:  No problem-specific Assessment & Plan notes found for this encounter.   No orders of the defined types were placed in this encounter.    I spent {CHL ONC TIME VISIT - ZOXWR:[id number]} counseling the patient face to face. The total time spent in the appointment was {CHL ONC TIME VISIT - BJYNW:[id number]}.  Anora Schwenke L Takesha Steger, PA-C 04/02/24

## 2024-04-02 NOTE — Telephone Encounter (Signed)
 Rescheduled Erika Blair for a later appointment to be able to use the New patient slot for a New patient.

## 2024-04-04 ENCOUNTER — Other Ambulatory Visit (HOSPITAL_COMMUNITY): Payer: Self-pay

## 2024-04-06 ENCOUNTER — Inpatient Hospital Stay: Admitting: Physician Assistant

## 2024-04-06 ENCOUNTER — Ambulatory Visit: Payer: 59 | Admitting: Physician Assistant

## 2024-04-06 ENCOUNTER — Other Ambulatory Visit: Payer: 59

## 2024-04-06 ENCOUNTER — Inpatient Hospital Stay: Attending: Physician Assistant

## 2024-04-09 ENCOUNTER — Telehealth: Payer: Self-pay | Admitting: Internal Medicine

## 2024-04-09 NOTE — Telephone Encounter (Signed)
 Left a voicemail with the reschedule appointment details.

## 2024-04-30 NOTE — Progress Notes (Signed)
 [facility] OFFICE PROGRESS NOTE  Erika Lauraine BRAVO, NP [address] [place] [address]  DIAGNOSIS: iron  deficiency anemia secondary to chronic blood loss from menorrhagia secondary to uterine fibroid   PRIOR THERAPY: None  CURRENT THERAPY: OTC iron  supplement p.o. daily. Will resume on 05/04/24.   INTERVAL HISTORY: Erika Blair 48 y.o. female returns to the clinic today for a follow up visit. The patient established care in the clinic today for a follow up visit in August 2024 for IDA secondary to menorrhagia secondary to uterine fibroid. She is no longer having monthly menstrual cycles. She saw GYN recently who arranged pelvis US  which showed fibroid. The patient also has not made up her mind about seeing GI for colonoscopy. She last saw Gyn on 02/27/24. She states since she no longer was having a heavy menstrual cycle that she can discontinue her iron . She has not taken this since May.   Otherwise the patient is feeling well today. She denies any significant fatigue. Denies any shortness of breath or lightheadedness. She denies any abnormal bleeding or bruising. If possible she would like to avoid IV iron  infusions. She is here for evaluation and repeat blood work.   MEDICAL HISTORY: Past Medical History:  Diagnosis Date   Asthma    Chicken pox    Positive TB test    Uterine fibroid     ALLERGIES:  is allergic to chloroquine and penicillins.  MEDICATIONS:  Current Outpatient Medications  Medication Sig Dispense Refill   ciprofloxacin  (CIPRO ) 500 MG tablet Take 1 tablet (500 mg total) by mouth 2 (two) times daily. 6 tablet 0   Ferrous Sulfate (IRON  PO) Take by mouth. PRN     No current facility-administered medications for this visit.    SURGICAL HISTORY: No past surgical history on file.  REVIEW OF SYSTEMS:   Review of Systems  Constitutional: Negative for appetite change, chills, fatigue, fever and unexpected weight change.  HENT:   Negative for  mouth sores, nosebleeds, sore throat and trouble swallowing.   Eyes: Negative for eye problems and icterus.  Respiratory: Negative for cough, hemoptysis, shortness of breath and wheezing.   Cardiovascular: Negative for chest pain and leg swelling.  Gastrointestinal: Negative for abdominal pain, constipation, diarrhea, nausea and vomiting.  Genitourinary: Negative for bladder incontinence, difficulty urinating, dysuria, frequency and hematuria.   Musculoskeletal: Negative for back pain, gait problem, neck pain and neck stiffness.  Skin: Negative for itching and rash.  Neurological: Negative for dizziness, extremity weakness, gait problem, headaches, light-headedness and seizures.  Hematological: Negative for adenopathy. Does not bruise/bleed easily.  Psychiatric/Behavioral: Negative for confusion, depression and sleep disturbance. The patient is not nervous/anxious.     PHYSICAL EXAMINATION:  Blood pressure 119/79, pulse 76, temperature 98 F (36.7 C), temperature source Temporal, resp. rate 15, weight 209 lb 6.4 oz (95 kg), SpO2 98%.  ECOG PERFORMANCE STATUS: 0  Physical Exam  Constitutional: Oriented to person, place, and time and well-developed, well-nourished, and in no distress.  HENT:  Head: Normocephalic and atraumatic.  Mouth/Throat: Oropharynx is clear and moist. No oropharyngeal exudate.  Eyes: Conjunctivae are normal. Right eye exhibits no discharge. Left eye exhibits no discharge. No scleral icterus.  Neck: Normal range of motion. Neck supple.  Cardiovascular: Normal rate, regular rhythm, normal heart sounds and intact distal pulses.   Pulmonary/Chest: Effort normal and breath sounds normal. No respiratory distress. No wheezes. No rales.  Abdominal: Soft. Bowel sounds are normal. Exhibits no distension and no  mass. There is no tenderness.  Musculoskeletal: Normal range of motion. Exhibits no edema.  Lymphadenopathy:    No cervical adenopathy.  Neurological: Alert and  oriented to person, place, and time. Exhibits normal muscle tone. Gait normal. Coordination normal.  Skin: Skin is warm and dry. No rash noted. Not diaphoretic. No erythema. No pallor.  Psychiatric: Mood, memory and judgment normal.  Vitals reviewed.  LABORATORY DATA: Lab Results  Component Value Date   WBC 4.5 05/04/2024   HGB 9.7 (L) 05/04/2024   HCT 31.6 (L) 05/04/2024   MCV 76.0 (L) 05/04/2024   PLT 284 05/04/2024      Chemistry      Component Value Date/Time   NA 136 06/25/2023 1019   NA 138 05/20/2021 1520   K 4.0 06/25/2023 1019   CL 106 06/25/2023 1019   CO2 25 06/25/2023 1019   BUN 16 06/25/2023 1019   BUN 8 05/20/2021 1520   CREATININE 0.80 06/25/2023 1019      Component Value Date/Time   CALCIUM 9.0 06/25/2023 1019   ALKPHOS 40 06/25/2023 1019   AST 17 06/25/2023 1019   ALT 11 06/25/2023 1019   BILITOT 0.3 06/25/2023 1019       RADIOGRAPHIC STUDIES:  No results found.   ASSESSMENT/PLAN:  This is a very pleasant 48 year old African female presented for evaluation of iron  deficiency anemia secondary to chronic blood loss from menorrhagia secondary to uterine fibroid.    She is on oral iron  supplements with OTC iron .  She takes this only when she has menstrual cycles.   Labs were reviewed. She is having worsening microcytic anemia with a Hbg of 9.7 and low MCV. She needs to resume taking her iron  supplements p.o. daily. Her iron  studies and ferritin are pending. I talked to her about IV iron  if her iron  is low. She would like to avoid IV iron  unless needed. Since she had improvement with her anemia with oral iron  supplements, we can consider starting with oral iron  and close interval follow up. If no significant improvement, then we can consider and revisit iron  infusions.    Her CBC today shows everything is WNL. Her iron  studies are pending.     We will see her back for labs and follow-up visit in 2 months.    The patient was advised to call immediately  if she has any concerning symptoms in the interval. The patient voices understanding of current disease status and treatment options and is in agreement with the current care plan. All questions were answered. The patient knows to call the clinic with any problems, questions or concerns. We can certainly see the patient much sooner if necessary     No orders of the defined types were placed in this encounter.    The total time spent in the appointment was 20-29 minutes  Helen Winterhalter L Grover [place], PA-C 05/04/24

## 2024-05-04 ENCOUNTER — Inpatient Hospital Stay: Attending: Physician Assistant

## 2024-05-04 ENCOUNTER — Inpatient Hospital Stay (HOSPITAL_BASED_OUTPATIENT_CLINIC_OR_DEPARTMENT_OTHER): Admitting: Physician Assistant

## 2024-05-04 VITALS — BP 119/79 | HR 76 | Temp 98.0°F | Resp 15 | Wt 209.4 lb

## 2024-05-04 DIAGNOSIS — D500 Iron deficiency anemia secondary to blood loss (chronic): Secondary | ICD-10-CM | POA: Diagnosis not present

## 2024-05-04 DIAGNOSIS — E611 Iron deficiency: Secondary | ICD-10-CM | POA: Diagnosis not present

## 2024-05-04 DIAGNOSIS — N920 Excessive and frequent menstruation with regular cycle: Secondary | ICD-10-CM | POA: Diagnosis not present

## 2024-05-04 LAB — CBC WITH DIFFERENTIAL (CANCER CENTER ONLY)
Abs Immature Granulocytes: 0.01 K/uL (ref 0.00–0.07)
Basophils Absolute: 0 K/uL (ref 0.0–0.1)
Basophils Relative: 1 %
Eosinophils Absolute: 0.3 K/uL (ref 0.0–0.5)
Eosinophils Relative: 8 %
HCT: 31.6 % — ABNORMAL LOW (ref 36.0–46.0)
Hemoglobin: 9.7 g/dL — ABNORMAL LOW (ref 12.0–15.0)
Immature Granulocytes: 0 %
Lymphocytes Relative: 29 %
Lymphs Abs: 1.3 K/uL (ref 0.7–4.0)
MCH: 23.3 pg — ABNORMAL LOW (ref 26.0–34.0)
MCHC: 30.7 g/dL (ref 30.0–36.0)
MCV: 76 fL — ABNORMAL LOW (ref 80.0–100.0)
Monocytes Absolute: 0.5 K/uL (ref 0.1–1.0)
Monocytes Relative: 11 %
Neutro Abs: 2.3 K/uL (ref 1.7–7.7)
Neutrophils Relative %: 51 %
Platelet Count: 284 K/uL (ref 150–400)
RBC: 4.16 MIL/uL (ref 3.87–5.11)
RDW: 15 % (ref 11.5–15.5)
WBC Count: 4.5 K/uL (ref 4.0–10.5)
nRBC: 0 % (ref 0.0–0.2)

## 2024-05-04 LAB — FERRITIN: Ferritin: 9 ng/mL — ABNORMAL LOW (ref 11–307)

## 2024-05-04 LAB — IRON AND IRON BINDING CAPACITY (CC-WL,HP ONLY)
Iron: 27 ug/dL — ABNORMAL LOW (ref 28–170)
Saturation Ratios: 6 % — ABNORMAL LOW (ref 10.4–31.8)
TIBC: 444 ug/dL (ref 250–450)
UIBC: 417 ug/dL (ref 148–442)

## 2024-05-10 ENCOUNTER — Telehealth: Payer: Self-pay

## 2024-05-10 NOTE — Telephone Encounter (Signed)
 Spoke with patient this morning regarding iron  studies. Per Cassie, PA - iron  levels are still low, but we will continue to monitor as patient takes iron  daily. Informed patient that follow-up will proceed as planned in 2 months. Patient voiced concern about current iron  supplement being in capsule form and expressed preference for a pill. Called her preferred pharmacy to check availability; pharmacy confirmed that ferrous sulfate is available in pill form. Informed Cassie, PA, who will send in the prescription today.

## 2024-05-11 ENCOUNTER — Telehealth: Payer: Self-pay | Admitting: Physician Assistant

## 2024-05-11 NOTE — Telephone Encounter (Signed)
 Left the patient a voicemail with the rescheduled appointment details per provider on Pal.

## 2024-06-01 ENCOUNTER — Ambulatory Visit: Payer: 59 | Admitting: Nurse Practitioner

## 2024-06-26 NOTE — Progress Notes (Deleted)
 [facility] OFFICE PROGRESS NOTE  Erika Lauraine BRAVO, NP [address] [place] [address]  DIAGNOSIS: Iron  deficiency anemia secondary to chronic blood loss from menorrhagia secondary to uterine fibroid   PRIOR THERAPY: None  CURRENT THERAPY: OTC iron  supplement p.o. daily. Will resume on 05/04/24.   INTERVAL HISTORY: Erika Blair 48 y.o. female returns  returns to the clinic today for a follow up visit. She was last seen in the clinic on 05/04/24. The patient established care in the clinic today for a follow up visit in August 2024 for IDA secondary to menorrhagia secondary to uterine fibroid. She is no longer having monthly menstrual cycles. ***  At her last appointment, she had been off her iron  supplement. She had resumed her iron  at her last appointment*** The patient also has not made up her mind about seeing GI for colonoscopy. She last saw Gyn on 02/27/24. She states since she no longer was having a heavy menstrual cycle. She has not taken this since May.   Otherwise the patient is feeling well today. She denies any significant fatigue. Denies any shortness of breath or lightheadedness. She denies any abnormal bleeding or bruising. If possible she would like to avoid IV iron  infusions. She is here for evaluation and repeat blood work.   MEDICAL HISTORY: Past Medical History:  Diagnosis Date   Asthma    Chicken pox    Positive TB test    Uterine fibroid     ALLERGIES:  is allergic to chloroquine and penicillins.  MEDICATIONS:  Current Outpatient Medications  Medication Sig Dispense Refill   ciprofloxacin  (CIPRO ) 500 MG tablet Take 1 tablet (500 mg total) by mouth 2 (two) times daily. 6 tablet 0   Ferrous Sulfate (IRON  PO) Take by mouth. PRN     No current facility-administered medications for this visit.    SURGICAL HISTORY: No past surgical history on file.  REVIEW OF SYSTEMS:   Review of Systems  Constitutional: Negative for appetite change,  chills, fatigue, fever and unexpected weight change.  HENT:   Negative for mouth sores, nosebleeds, sore throat and trouble swallowing.   Eyes: Negative for eye problems and icterus.  Respiratory: Negative for cough, hemoptysis, shortness of breath and wheezing.   Cardiovascular: Negative for chest pain and leg swelling.  Gastrointestinal: Negative for abdominal pain, constipation, diarrhea, nausea and vomiting.  Genitourinary: Negative for bladder incontinence, difficulty urinating, dysuria, frequency and hematuria.   Musculoskeletal: Negative for back pain, gait problem, neck pain and neck stiffness.  Skin: Negative for itching and rash.  Neurological: Negative for dizziness, extremity weakness, gait problem, headaches, light-headedness and seizures.  Hematological: Negative for adenopathy. Does not bruise/bleed easily.  Psychiatric/Behavioral: Negative for confusion, depression and sleep disturbance. The patient is not nervous/anxious.     PHYSICAL EXAMINATION:  There were no vitals taken for this visit.  ECOG PERFORMANCE STATUS: {CHL ONC ECOG [id number]  Physical Exam  Constitutional: Oriented to person, place, and time and well-developed, well-nourished, and in no distress. No distress.  HENT:  Head: Normocephalic and atraumatic.  Mouth/Throat: Oropharynx is clear and moist. No oropharyngeal exudate.  Eyes: Conjunctivae are normal. Right eye exhibits no discharge. Left eye exhibits no discharge. No scleral icterus.  Neck: Normal range of motion. Neck supple.  Cardiovascular: Normal rate, regular rhythm, normal heart sounds and intact distal pulses.   Pulmonary/Chest: Effort normal and breath sounds normal. No respiratory distress. No wheezes. No rales.  Abdominal: Soft. Bowel sounds are normal. Exhibits  no distension and no mass. There is no tenderness.  Musculoskeletal: Normal range of motion. Exhibits no edema.  Lymphadenopathy:    No cervical adenopathy.  Neurological:  Alert and oriented to person, place, and time. Exhibits normal muscle tone. Gait normal. Coordination normal.  Skin: Skin is warm and dry. No rash noted. Not diaphoretic. No erythema. No pallor.  Psychiatric: Mood, memory and judgment normal.  Vitals reviewed.  LABORATORY DATA: Lab Results  Component Value Date   WBC 4.5 05/04/2024   HGB 9.7 (L) 05/04/2024   HCT 31.6 (L) 05/04/2024   MCV 76.0 (L) 05/04/2024   PLT 284 05/04/2024      Chemistry      Component Value Date/Time   NA 136 06/25/2023 1019   NA 138 05/20/2021 1520   K 4.0 06/25/2023 1019   CL 106 06/25/2023 1019   CO2 25 06/25/2023 1019   BUN 16 06/25/2023 1019   BUN 8 05/20/2021 1520   CREATININE 0.80 06/25/2023 1019      Component Value Date/Time   CALCIUM 9.0 06/25/2023 1019   ALKPHOS 40 06/25/2023 1019   AST 17 06/25/2023 1019   ALT 11 06/25/2023 1019   BILITOT 0.3 06/25/2023 1019       RADIOGRAPHIC STUDIES:  No results found.   ASSESSMENT/PLAN:  This is a very pleasant 48 year old African female presented for evaluation of iron  deficiency anemia secondary to chronic blood loss from menorrhagia secondary to uterine fibroid.    She is on oral iron  supplements with OTC iron .  She takes this only when she has menstrual cycles.***   Labs were reviewed. She is having worsening microcytic anemia with a Hbg of *** and low MCV. She needs to resume taking her iron  supplements p.o. daily. Her iron  studies and ferritin are pending.   I talked to her about IV iron  if her iron  is low. She would like to avoid IV iron  unless needed. Since she had improvement with her anemia with oral iron  supplements, we can consider starting with oral iron  and close interval follow up. If no significant improvement, then we can consider and revisit iron  infusions.     Her CBC today shows everything is WNL. Her iron  studies are pending.     We will see her back for labs and follow-up visit in *** months.   The patient was advised  to call immediately if she has any concerning symptoms in the interval. The patient voices understanding of current disease status and treatment options and is in agreement with the current care plan. All questions were answered. The patient knows to call the clinic with any problems, questions or concerns. We can certainly see the patient much sooner if necessary    No orders of the defined types were placed in this encounter.    I spent {CHL ONC TIME VISIT - DTPQU:[id number]} counseling the patient face to face. The total time spent in the appointment was {CHL ONC TIME VISIT - DTPQU:[id number]}.  Placida Cambre L Manessa Buley, PA-C 06/26/24

## 2024-06-29 ENCOUNTER — Inpatient Hospital Stay: Admitting: Physician Assistant

## 2024-06-29 ENCOUNTER — Other Ambulatory Visit: Payer: Self-pay | Admitting: Physician Assistant

## 2024-06-29 ENCOUNTER — Inpatient Hospital Stay: Attending: Physician Assistant

## 2024-06-29 DIAGNOSIS — D500 Iron deficiency anemia secondary to blood loss (chronic): Secondary | ICD-10-CM | POA: Insufficient documentation

## 2024-06-29 DIAGNOSIS — E611 Iron deficiency: Secondary | ICD-10-CM

## 2024-06-29 DIAGNOSIS — N920 Excessive and frequent menstruation with regular cycle: Secondary | ICD-10-CM | POA: Insufficient documentation

## 2024-07-06 ENCOUNTER — Other Ambulatory Visit

## 2024-07-06 ENCOUNTER — Ambulatory Visit: Admitting: Physician Assistant

## 2024-07-06 ENCOUNTER — Telehealth: Payer: Self-pay | Admitting: Physician Assistant

## 2024-07-06 NOTE — Telephone Encounter (Signed)
 Scheduled appointments per staff message. Talked with the patient and she is aware of the made appointments.

## 2024-07-11 NOTE — Progress Notes (Signed)
 [facility] OFFICE PROGRESS NOTE  Erika Lauraine BRAVO, Erika Blair [address] 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 daily. Will resume on 05/04/24.   INTERVAL HISTORY: Erika Blair 48 y.o. female returns to the clinic today for a follow up visit. She was last seen in the clinic on 05/04/24. The patient established care in the clinic today for a follow up visit in August 2024 for IDA secondary to menorrhagia secondary to uterine fibroid. She is no longer having monthly menstrual cycles but she does spot every few days. She has an IUD.   At her last appointment, she had been off her iron  supplement. She had resumed her iron  at her last appointment still taking it.  The patient also has not made up her mind about seeing GI for colonoscopy.  Otherwise the patient is feeling well today. She denies any significant fatigue.  Her energy is better. Denies any shortness of breath or lightheadedness. She denies any abnormal bleeding or bruising. If possible she would like to avoid IV iron  infusions. She is here for evaluation and repeat blood work.   MEDICAL HISTORY: Past Medical History:  Diagnosis Date   Asthma    Chicken pox    Positive TB test    Uterine fibroid     ALLERGIES:  is allergic to chloroquine and penicillins.  MEDICATIONS:  Current Outpatient Medications  Medication Sig Dispense Refill   ciprofloxacin  (CIPRO ) 500 MG tablet Take 1 tablet (500 mg total) by mouth 2 (two) times daily. 6 tablet 0   Ferrous Sulfate (IRON  PO) Take by mouth. PRN     No current facility-administered medications for this visit.    SURGICAL HISTORY: No past surgical history on file.  REVIEW OF SYSTEMS:   Review of Systems  Constitutional: Negative for appetite change, chills, fatigue, fever and unexpected weight change.   HENT: Negative for mouth sores, nosebleeds, sore throat and trouble swallowing.   Eyes: Negative for eye problems and icterus.  Respiratory: Negative for cough, hemoptysis, shortness of breath and wheezing.   Cardiovascular: Negative for chest pain and leg swelling.  Gastrointestinal: Negative for abdominal pain, constipation, diarrhea, nausea and vomiting.  Genitourinary: Negative for bladder incontinence, difficulty urinating, dysuria, frequency and hematuria.   Musculoskeletal: Negative for back pain, gait problem, neck pain and neck stiffness.  Skin: Negative for itching and rash.  Neurological: Negative for dizziness, extremity weakness, gait problem, headaches, light-headedness and seizures.  Hematological: Negative for adenopathy. Does not bruise/bleed easily.  Psychiatric/Behavioral: Negative for confusion, depression and sleep disturbance. The patient is not nervous/anxious.     PHYSICAL EXAMINATION:  Blood pressure 98/64, pulse 72, temperature 97.7 F (36.5 C), temperature source Temporal, resp. rate 14, weight 210 lb 11.2 oz (95.6 kg), SpO2 99%.  ECOG PERFORMANCE STATUS: 0  Physical Exam  Constitutional: Oriented to person, place, and time and well-developed, well-nourished, and in no distress.  HENT:  Head: Normocephalic and atraumatic.  Mouth/Throat: Oropharynx is clear and moist. No oropharyngeal exudate.  Eyes: Conjunctivae are normal. Right eye exhibits no discharge. Left eye exhibits no discharge. No scleral icterus.  Neck: Normal range of motion. Neck supple.  Cardiovascular: Normal rate, regular rhythm, normal heart sounds and intact distal pulses.   Pulmonary/Chest: Effort normal and breath sounds normal. No respiratory distress. No wheezes. No rales.  Abdominal: Soft. Bowel sounds are  normal. Exhibits no distension and no mass. There is no tenderness.  Musculoskeletal: Normal range of motion. Exhibits no edema.  Lymphadenopathy:    No cervical adenopathy.   Neurological: Alert and oriented to person, place, and time. Exhibits normal muscle tone. Gait normal. Coordination normal.  Skin: Skin is warm and dry. No rash noted. Not diaphoretic. No erythema. No pallor.  Psychiatric: Mood, memory and judgment normal.  Vitals reviewed.  LABORATORY DATA: Lab Results  Component Value Date   WBC 3.5 (L) 07/20/2024   HGB 12.2 07/20/2024   HCT 38.3 07/20/2024   MCV 77.1 (L) 07/20/2024   PLT 317 07/20/2024      Chemistry      Component Value Date/Time   NA 136 06/25/2023 1019   NA 138 05/20/2021 1520   K 4.0 06/25/2023 1019   CL 106 06/25/2023 1019   CO2 25 06/25/2023 1019   BUN 16 06/25/2023 1019   BUN 8 05/20/2021 1520   CREATININE 0.80 06/25/2023 1019      Component Value Date/Time   CALCIUM 9.0 06/25/2023 1019   ALKPHOS 40 06/25/2023 1019   AST 17 06/25/2023 1019   ALT 11 06/25/2023 1019   BILITOT 0.3 06/25/2023 1019       RADIOGRAPHIC STUDIES:  No results found.   ASSESSMENT/PLAN:  This is a very pleasant 48 year old African female presented for evaluation of iron  deficiency anemia secondary to chronic blood loss from menorrhagia secondary to uterine fibroid.    She is on oral iron  supplements with OTC iron .     Labs were reviewed. Her Hbg has improved to 12.2.  She needs to resume taking her iron  supplements p.o. daily. Her iron  studies and ferritin are pending.   I talked to her about IV iron  if her iron  is low. She would like to avoid IV iron  unless needed. Since she had improvement with her anemia with oral iron  supplements, we can consider starting with oral iron  and close interval follow up. If no significant improvement, then we can consider and revisit iron  infusions.    Positive response to oral iron . Improved energy, no dyspnea or lightheadedness, no abnormal bleeding or bruising. - Continue oral iron  with vitamin C daily. - Monitor hemoglobin and iron  levels; communicate results via MyChart. - Encourage  gastroenterologist consultation to rule out GI blood loss. She would like to think about it.  - Encouraged to please continue her iron  supplement.  - Schedule follow-up in three months.  Abnormal uterine bleeding associated with intrauterine device (IUD) Intermittent spotting outside expected menstrual cycle with IUD. -Encouraged her to follow up with OBGYN regarding abnormal bleeding pattern with IUD if this is expected.    The patient was advised to call immediately if she has any concerning symptoms in the interval. The patient voices understanding of current disease status and treatment options and is in agreement with the current care plan. All questions were answered. The patient knows to call the clinic with any problems, questions or concerns. We can certainly see the patient much sooner if necessary   No orders of the defined types were placed in this encounter.    The total time spent in the appointment was 20-29 minutes  Herley Bernardini L Linell Meldrum, PA-C 07/20/24

## 2024-07-20 ENCOUNTER — Inpatient Hospital Stay

## 2024-07-20 ENCOUNTER — Inpatient Hospital Stay: Admitting: Physician Assistant

## 2024-07-20 VITALS — BP 98/64 | HR 72 | Temp 97.7°F | Resp 14 | Wt 210.7 lb

## 2024-07-20 DIAGNOSIS — E611 Iron deficiency: Secondary | ICD-10-CM | POA: Diagnosis not present

## 2024-07-20 DIAGNOSIS — D500 Iron deficiency anemia secondary to blood loss (chronic): Secondary | ICD-10-CM | POA: Diagnosis not present

## 2024-07-20 DIAGNOSIS — N920 Excessive and frequent menstruation with regular cycle: Secondary | ICD-10-CM | POA: Diagnosis not present

## 2024-07-20 LAB — CBC WITH DIFFERENTIAL (CANCER CENTER ONLY)
Abs Immature Granulocytes: 0.01 K/uL (ref 0.00–0.07)
Basophils Absolute: 0 K/uL (ref 0.0–0.1)
Basophils Relative: 1 %
Eosinophils Absolute: 0.3 K/uL (ref 0.0–0.5)
Eosinophils Relative: 9 %
HCT: 38.3 % (ref 36.0–46.0)
Hemoglobin: 12.2 g/dL (ref 12.0–15.0)
Immature Granulocytes: 0 %
Lymphocytes Relative: 37 %
Lymphs Abs: 1.3 K/uL (ref 0.7–4.0)
MCH: 24.5 pg — ABNORMAL LOW (ref 26.0–34.0)
MCHC: 31.9 g/dL (ref 30.0–36.0)
MCV: 77.1 fL — ABNORMAL LOW (ref 80.0–100.0)
Monocytes Absolute: 0.4 K/uL (ref 0.1–1.0)
Monocytes Relative: 10 %
Neutro Abs: 1.5 K/uL — ABNORMAL LOW (ref 1.7–7.7)
Neutrophils Relative %: 43 %
Platelet Count: 317 K/uL (ref 150–400)
RBC: 4.97 MIL/uL (ref 3.87–5.11)
RDW: 17.7 % — ABNORMAL HIGH (ref 11.5–15.5)
WBC Count: 3.5 K/uL — ABNORMAL LOW (ref 4.0–10.5)
nRBC: 0 % (ref 0.0–0.2)

## 2024-07-20 LAB — FERRITIN: Ferritin: 34 ng/mL (ref 11–307)

## 2024-07-20 LAB — IRON AND IRON BINDING CAPACITY (CC-WL,HP ONLY)
Iron: 46 ug/dL (ref 28–170)
Saturation Ratios: 12 % (ref 10.4–31.8)
TIBC: 388 ug/dL (ref 250–450)
UIBC: 342 ug/dL (ref 148–442)

## 2024-07-25 ENCOUNTER — Encounter: Payer: Self-pay | Admitting: Physician Assistant

## 2024-08-14 ENCOUNTER — Telehealth: Payer: Self-pay | Admitting: *Deleted

## 2024-08-14 NOTE — Telephone Encounter (Signed)
 Pt called to confirm date of last exam. Exam date 02/27/24 confirmed. Pt sts this visit is not being counted as her annual exam for [employer] deduction. Advised pt that Dr. Alger is not a PCP but an OB/GYN and this may be why it is not being counted. Pt advised to contact insurance company.

## 2024-10-09 NOTE — Progress Notes (Unsigned)
 [facility] OFFICE PROGRESS NOTE  Elnor Lauraine BRAVO, NP [address] [place] [address]  DIAGNOSIS: Iron  deficiency anemia secondary to chronic blood loss from menorrhagia secondary to uterine fibroid   PRIOR THERAPY: None  CURRENT THERAPY: OTC iron  supplement p.o. daily. Will resume on 05/04/24.   INTERVAL HISTORY: Erika Blair 48 y.o. female returns to the clinic today for a follow up visit. She was last seen in the clinic on 07/20/24. The patient established care in the clinic today for a follow up visit in August 2024 for IDA secondary to menorrhagia secondary to uterine fibroid. She is no longer having monthly menstrual cycles but she does spot every few days. She has an IUD.    ***Make sure she is taking the iron  supplement.The patient also has not made up her mind about seeing GI for colonoscopy.   Otherwise the patient is feeling well today. She denies any significant fatigue.  Her energy is better. Denies any shortness of breath or lightheadedness. She denies any abnormal bleeding or bruising. If possible she would like to avoid IV iron  infusions. She is here for evaluation and repeat blood work.  MEDICAL HISTORY: Past Medical History:  Diagnosis Date   Asthma    Chicken pox    Positive TB test    Uterine fibroid     ALLERGIES:  is allergic to chloroquine and penicillins.  MEDICATIONS:  Current Outpatient Medications  Medication Sig Dispense Refill   ciprofloxacin  (CIPRO ) 500 MG tablet Take 1 tablet (500 mg total) by mouth 2 (two) times daily. 6 tablet 0   Ferrous Sulfate (IRON  PO) Take by mouth. PRN     No current facility-administered medications for this visit.    SURGICAL HISTORY: No past surgical history on file.  REVIEW OF SYSTEMS:   Review of Systems  Constitutional: Negative for appetite change, chills, fatigue, fever and unexpected weight change.  HENT:   Negative for mouth sores, nosebleeds, sore throat and trouble swallowing.    Eyes: Negative for eye problems and icterus.  Respiratory: Negative for cough, hemoptysis, shortness of breath and wheezing.   Cardiovascular: Negative for chest pain and leg swelling.  Gastrointestinal: Negative for abdominal pain, constipation, diarrhea, nausea and vomiting.  Genitourinary: Negative for bladder incontinence, difficulty urinating, dysuria, frequency and hematuria.   Musculoskeletal: Negative for back pain, gait problem, neck pain and neck stiffness.  Skin: Negative for itching and rash.  Neurological: Negative for dizziness, extremity weakness, gait problem, headaches, light-headedness and seizures.  Hematological: Negative for adenopathy. Does not bruise/bleed easily.  Psychiatric/Behavioral: Negative for confusion, depression and sleep disturbance. The patient is not nervous/anxious.     PHYSICAL EXAMINATION:  There were no vitals taken for this visit.  ECOG PERFORMANCE STATUS: {CHL ONC ECOG [id number]  Physical Exam  Constitutional: Oriented to person, place, and time and well-developed, well-nourished, and in no distress. No distress.  HENT:  Head: Normocephalic and atraumatic.  Mouth/Throat: Oropharynx is clear and moist. No oropharyngeal exudate.  Eyes: Conjunctivae are normal. Right eye exhibits no discharge. Left eye exhibits no discharge. No scleral icterus.  Neck: Normal range of motion. Neck supple.  Cardiovascular: Normal rate, regular rhythm, normal heart sounds and intact distal pulses.   Pulmonary/Chest: Effort normal and breath sounds normal. No respiratory distress. No wheezes. No rales.  Abdominal: Soft. Bowel sounds are normal. Exhibits no distension and no mass. There is no tenderness.  Musculoskeletal: Normal range of motion. Exhibits no edema.  Lymphadenopathy:    No  cervical adenopathy.  Neurological: Alert and oriented to person, place, and time. Exhibits normal muscle tone. Gait normal. Coordination normal.  Skin: Skin is warm and dry.  No rash noted. Not diaphoretic. No erythema. No pallor.  Psychiatric: Mood, memory and judgment normal.  Vitals reviewed.  LABORATORY DATA: Lab Results  Component Value Date   WBC 3.5 (L) 07/20/2024   HGB 12.2 07/20/2024   HCT 38.3 07/20/2024   MCV 77.1 (L) 07/20/2024   PLT 317 07/20/2024      Chemistry      Component Value Date/Time   NA 136 06/25/2023 1019   NA 138 05/20/2021 1520   K 4.0 06/25/2023 1019   CL 106 06/25/2023 1019   CO2 25 06/25/2023 1019   BUN 16 06/25/2023 1019   BUN 8 05/20/2021 1520   CREATININE 0.80 06/25/2023 1019      Component Value Date/Time   CALCIUM 9.0 06/25/2023 1019   ALKPHOS 40 06/25/2023 1019   AST 17 06/25/2023 1019   ALT 11 06/25/2023 1019   BILITOT 0.3 06/25/2023 1019       RADIOGRAPHIC STUDIES:  No results found.   ASSESSMENT/PLAN:  This is a very pleasant 48 year old African female presented for evaluation of iron  deficiency anemia secondary to chronic blood loss from menorrhagia secondary to uterine fibroid.    She is on oral iron  supplements with OTC iron .     Labs were reviewed. Her Hbg has *** to ***.  Her iron  studies and ferritin are pending.    ***I talked to her about IV iron  if her iron  is low. She would like to avoid IV iron  unless needed. Since she had improvement with her anemia with oral iron  supplements, we can consider starting with oral iron  and close interval follow up. If no significant improvement, then we can consider and revisit iron  infusions.     ***Positive response to oral iron . Improved energy, no dyspnea or lightheadedness, no abnormal bleeding or bruising. - Continue oral iron  with vitamin C daily. - Monitor hemoglobin and iron  levels; communicate results via MyChart. - Encourage gastroenterologist consultation to rule out GI blood loss. She would like to think about it.  - Encouraged to please continue her iron  supplement.  - Schedule follow-up in three months.   Abnormal uterine bleeding  associated with intrauterine device (IUD) Intermittent spotting outside expected menstrual cycle with IUD. -Encouraged her to follow up with OBGYN regarding abnormal bleeding pattern with IUD if this is expected.      No orders of the defined types were placed in this encounter.    I spent {CHL ONC TIME VISIT - DTPQU:[id number]} counseling the patient face to face. The total time spent in the appointment was {CHL ONC TIME VISIT - DTPQU:[id number]}.  Blannie Shedlock L Kashayla Ungerer, PA-C 10/09/2024

## 2024-10-12 ENCOUNTER — Telehealth: Payer: Self-pay

## 2024-10-12 ENCOUNTER — Inpatient Hospital Stay: Attending: Physician Assistant

## 2024-10-12 ENCOUNTER — Ambulatory Visit: Admitting: Physician Assistant

## 2024-10-12 VITALS — BP 104/58 | HR 71 | Temp 97.8°F | Resp 18 | Wt 203.0 lb

## 2024-10-12 DIAGNOSIS — N920 Excessive and frequent menstruation with regular cycle: Secondary | ICD-10-CM | POA: Diagnosis present

## 2024-10-12 DIAGNOSIS — E611 Iron deficiency: Secondary | ICD-10-CM

## 2024-10-12 DIAGNOSIS — D259 Leiomyoma of uterus, unspecified: Secondary | ICD-10-CM | POA: Insufficient documentation

## 2024-10-12 DIAGNOSIS — D500 Iron deficiency anemia secondary to blood loss (chronic): Secondary | ICD-10-CM | POA: Insufficient documentation

## 2024-10-12 LAB — CBC WITH DIFFERENTIAL (CANCER CENTER ONLY)
Abs Immature Granulocytes: 0.01 K/uL (ref 0.00–0.07)
Basophils Absolute: 0 K/uL (ref 0.0–0.1)
Basophils Relative: 1 %
Eosinophils Absolute: 0.2 K/uL (ref 0.0–0.5)
Eosinophils Relative: 4 %
HCT: 37 % (ref 36.0–46.0)
Hemoglobin: 12 g/dL (ref 12.0–15.0)
Immature Granulocytes: 0 %
Lymphocytes Relative: 32 %
Lymphs Abs: 1.2 K/uL (ref 0.7–4.0)
MCH: 26.3 pg (ref 26.0–34.0)
MCHC: 32.4 g/dL (ref 30.0–36.0)
MCV: 81.1 fL (ref 80.0–100.0)
Monocytes Absolute: 0.4 K/uL (ref 0.1–1.0)
Monocytes Relative: 10 %
Neutro Abs: 1.9 K/uL (ref 1.7–7.7)
Neutrophils Relative %: 53 %
Platelet Count: 300 K/uL (ref 150–400)
RBC: 4.56 MIL/uL (ref 3.87–5.11)
RDW: 14.2 % (ref 11.5–15.5)
WBC Count: 3.7 K/uL — ABNORMAL LOW (ref 4.0–10.5)
nRBC: 0 % (ref 0.0–0.2)

## 2024-10-12 LAB — IRON AND IRON BINDING CAPACITY (CC-WL,HP ONLY)
Iron: 64 ug/dL (ref 28–170)
Saturation Ratios: 16 % (ref 10.4–31.8)
TIBC: 393 ug/dL (ref 250–450)
UIBC: 330 ug/dL

## 2024-10-12 LAB — FERRITIN: Ferritin: 26 ng/mL (ref 11–307)

## 2024-10-12 NOTE — Telephone Encounter (Signed)
 Patient showed up for appt today.

## 2024-10-12 NOTE — Telephone Encounter (Signed)
 Tried to reach patient in regards to missed appt today.  LVM asking for a return call to reschedule.

## 2024-10-31 ENCOUNTER — Other Ambulatory Visit (HOSPITAL_COMMUNITY): Payer: Self-pay

## 2024-10-31 MED ORDER — PENICILLIN V POTASSIUM 500 MG PO TABS
ORAL_TABLET | ORAL | 0 refills | Status: AC
  Filled 2024-10-31: qty 40, 10d supply, fill #0

## 2024-11-02 ENCOUNTER — Encounter: Admitting: Nurse Practitioner

## 2025-01-03 ENCOUNTER — Encounter: Admitting: Nurse Practitioner
